# Patient Record
Sex: Female | Born: 1940 | Race: White | Hispanic: No | Marital: Married | Smoking: Never smoker
Health system: Southern US, Community
[De-identification: ages and names within clinical notes are randomized; demographics above are authoritative.]

## PROBLEM LIST (undated history)

## (undated) DIAGNOSIS — K219 Gastro-esophageal reflux disease without esophagitis: Secondary | ICD-10-CM

## (undated) DIAGNOSIS — I10 Essential (primary) hypertension: Secondary | ICD-10-CM

## (undated) DIAGNOSIS — Z87442 Personal history of urinary calculi: Secondary | ICD-10-CM

## (undated) HISTORY — PX: APPENDECTOMY: SHX54

## (undated) HISTORY — PX: TONSILLECTOMY: SUR1361

## (undated) HISTORY — PX: ABDOMINAL HYSTERECTOMY: SHX81

## (undated) HISTORY — PX: JOINT REPLACEMENT: SHX530

---

## 2004-07-12 ENCOUNTER — Encounter: Admission: RE | Admit: 2004-07-12 | Discharge: 2004-07-12 | Payer: Self-pay | Admitting: Family Medicine

## 2005-11-05 ENCOUNTER — Encounter: Admission: RE | Admit: 2005-11-05 | Discharge: 2005-11-05 | Payer: Self-pay | Admitting: Plastic Surgery

## 2005-11-12 ENCOUNTER — Ambulatory Visit (HOSPITAL_COMMUNITY): Admission: RE | Admit: 2005-11-12 | Discharge: 2005-11-12 | Payer: Self-pay | Admitting: Plastic Surgery

## 2006-01-04 ENCOUNTER — Encounter: Admission: RE | Admit: 2006-01-04 | Discharge: 2006-01-04 | Payer: Self-pay | Admitting: Family Medicine

## 2006-09-26 ENCOUNTER — Emergency Department (HOSPITAL_COMMUNITY): Admission: EM | Admit: 2006-09-26 | Discharge: 2006-09-26 | Payer: Self-pay | Admitting: Emergency Medicine

## 2007-01-27 ENCOUNTER — Encounter: Admission: RE | Admit: 2007-01-27 | Discharge: 2007-01-27 | Payer: Self-pay | Admitting: Family Medicine

## 2007-10-06 ENCOUNTER — Inpatient Hospital Stay (HOSPITAL_COMMUNITY): Admission: RE | Admit: 2007-10-06 | Discharge: 2007-10-09 | Payer: Self-pay | Admitting: Orthopedic Surgery

## 2008-03-02 ENCOUNTER — Encounter: Admission: RE | Admit: 2008-03-02 | Discharge: 2008-03-02 | Payer: Self-pay | Admitting: Family Medicine

## 2009-03-08 ENCOUNTER — Encounter: Admission: RE | Admit: 2009-03-08 | Discharge: 2009-03-08 | Payer: Self-pay | Admitting: Family Medicine

## 2009-05-05 ENCOUNTER — Encounter: Admission: RE | Admit: 2009-05-05 | Discharge: 2009-05-05 | Payer: Self-pay | Admitting: Family Medicine

## 2010-03-14 ENCOUNTER — Encounter: Admission: RE | Admit: 2010-03-14 | Discharge: 2010-03-14 | Payer: Self-pay | Admitting: Family Medicine

## 2011-03-27 ENCOUNTER — Other Ambulatory Visit: Payer: Self-pay | Admitting: Family Medicine

## 2011-03-27 DIAGNOSIS — Z1231 Encounter for screening mammogram for malignant neoplasm of breast: Secondary | ICD-10-CM

## 2011-04-04 ENCOUNTER — Ambulatory Visit
Admission: RE | Admit: 2011-04-04 | Discharge: 2011-04-04 | Disposition: A | Discharge status: Home / Self Care | Payer: Medicare Other | Source: Ambulatory Visit | Attending: Family Medicine | Admitting: Family Medicine

## 2011-04-04 DIAGNOSIS — Z1231 Encounter for screening mammogram for malignant neoplasm of breast: Secondary | ICD-10-CM

## 2011-05-01 NOTE — Op Note (Signed)
NAMESHATON, LORE NO.:  [id number]   MEDICAL RECORD NO.:  [id number]          PATIENT TYPE:  INP   LOCATION:  0005                         FACILITY:  [facility]   PHYSICIAN:  Ollen Gross, M.D.    DATE OF BIRTH:  10/21/1941   DATE OF PROCEDURE:  10/06/2007  DATE OF DISCHARGE:                               OPERATIVE REPORT   PREOPERATIVE DIAGNOSES:  Osteoarthritis left hip with protrusio  deformity.   POSTOPERATIVE DIAGNOSES:  Osteoarthritis left hip with protrusio  deformity.   PROCEDURE:  Left total hip arthroplasty with acetabular autografting.   SURGEON:  Dr. Homero Fellers Aluisio.   ASSISTANT:  Avel Peace, PA-C.   ANESTHESIA:  General.   ESTIMATED BLOOD LOSS:  450.   DRAINS:  None.   COMPLICATIONS:  None.   CONDITION:  Stable to recovery.   BRIEF CLINICAL NOTE:  Ms. Hartzog is a 70 year old female who has end-  stage arthritis of the left hip with severe protrusio deformity.  She  has failed nonoperative management and presents for total hip  arthroplasty.   PROCEDURE IN DETAIL:  After successful administration of general  anesthetic, the patient's placed in the right lateral decubitus position  with the left side up and held with the hip positioner. The left lower  extremity was isolated from her perineum with plastic drapes and prepped  and draped in the usual sterile fashion. A short posterolateral incision  was made with a 10 blade through the subcutaneous tissue to the fascia  lata which was incised in line with the skin incision.  The sciatic  nerve was palpated and protected and the short rotators isolated off the  femur.  Capsulectomy was performed.  I attempted to dislocate the hip  but the femoral head was so far protruded that it was not safe to do  this.  We then cut the femoral neck in situ and then translated the  femur anteriorly.  I then placed a trial prosthesis in the position such  that the center of the trial head would be  slightly below the tip of the  greater trochanter and marked the osteotomy line on the femoral neck.  Osteotomy is made with an oscillating saw.  The remainder of the femoral  neck that we resected is removed.  We are now able to translate the  femur easier anteriorly to gain better acetabular exposure.   The acetabular retractors were placed and labrum removed.  I used the  corkscrew to remove the femoral head from the acetabulum.  It was  severely protruded.  We started reaming at 43 just to abrade the medial  wall and then I just reamed at the rim up to 53.  We then took the  cancellus bone from the resected femoral head and the femoral neck and  impacted that into the acetabular protrusio defect and essentially  lateralized it back to a more normal location.  I placed the reverse  reamer, a size 51, to impact it. The 54 mm pinnacle acetabular shell was  then placed in an anatomic position with excellent purchase and  transfixed with  two dome screws.  The apex hole eliminator was placed  and the permanent 36 mm neutral Ultamet metal liner was placed.   The femur was prepared with canal finder irrigation.  Axial reaming was  performed to 15.5 mm, proximal reaming to a 67F and the sleeve machined  to a small.  A 67F small trial sleeve was placed with a 20 x 15 stem and  a 36 standard neck matching native anteversion.  A trial 36 plus 0 head  is placed.  The hip is reduced with great stability.  There is full  extension and full external rotation, 70 degrees flexion, 40 degrees  adduction, 90 degrees internal rotation and 90 degrees of flexion and 70  degrees of internal rotation.  By placing the left leg on top of the  right, I felt as though the leg lengths were equal.  The hip was then  dislocated and trials removed.  A permanent 67F small sleeve is placed  with a 20 x 15 stem, 36 standard neck matching native anteversion.  A 36  plus 0 head is placed and the hip is reduced with the  same stability  parameters. The wound is copiously irrigated with saline solution and  short rotators reattached to the femur through drill holes. The fascia  lata was closed with interrupted #1 Vicryl,  subcu closed #1 and #2-0  Vicryl, subcuticular running 4-0 Monocryl.  The incision was cleaned and  dried and Steri-Strips and a bulky sterile dressing applied.  She is  then placed into a knee immobilizer, awakened and transported to  recovery in stable condition.      Ollen Gross, M.D.  Electronically Signed     FA/MEDQ  D:  10/06/2007  T:  10/07/2007  Job:  [id number]

## 2011-05-01 NOTE — H&P (Signed)
Annette Hernandez, Annette Hernandez NO.:  [id number]   MEDICAL RECORD NO.:  [id number]          PATIENT TYPE:  INP   LOCATION:  1603                         FACILITY:  [facility]   PHYSICIAN:  Ollen Gross, M.D.    DATE OF BIRTH:  06/22/1941   DATE OF ADMISSION:  10/06/2007  DATE OF DISCHARGE:                              HISTORY & PHYSICAL   DATE OF OFFICE VISIT HISTORY AND PHYSICAL:  September 25, 2007.   CHIEF COMPLAINT:  Left hip pain.   HISTORY OF PRESENT ILLNESS:  The patient is a 70 year old female who has  been seeing Dr. Lequita Halt for a long history in regards to her left hip.  She has been having pain for over 10 years now.  X-rays in the past  history have shown she has had Paget's disease which was diagnosed and  treated at [facility].  She has been quite active, but unfortunately has  progressive in pain and dysfunction.  She is seen by Dr. Lequita Halt, found  to have end-stage arthritis with a severe protrusio deformity.  It is  felt that she would best be served by undergoing surgical intervention.  Risks and benefits discussed.  The patient subsequently was admitted to  the hospital.   ALLERGIES:  NO KNOWN DRUG ALLERGIES.   CURRENT MEDICATIONS:  1. Tylenol.  2. Tramadol.  3. Micardis.  4. Premarin.  5. Multivitamin.   PAST MEDICAL HISTORY:  1. Hypertension.  2. Mitral valve prolapse.  3. Paget's disease.  4. Postmenopausal.   PAST SURGICAL HISTORY:  1. C-section.  2. History of hysterectomy with bladder tack.   SOCIAL HISTORY:  Married, nonsmoker.  Two glasses of wine or two beers  daily.  Two children.  Husband will be assisting with care after  surgery.   REVIEW OF SYSTEMS:  GENERAL:  No fevers, chills, night sweats.  NEUROLOGIC:  No seizures, syncope or paralysis.  RESPIRATORY:  No  shortness of breath, productive cough or hemoptysis.  CARDIOVASCULAR:  No chest pain, angina or orthopnea.  GI: No nausea, vomiting, diarrhea  or constipation.  GU: No dysuria,  hematuria or discharge.  MUSCULOSKELETAL:  Left hip.   PHYSICAL EXAMINATION:  VITAL SIGNS:  Pulse 74, respirations 12, blood  pressure 146/70.  GENERAL:  A 70 year old white female, well-nourished, well-developed,  petite, thin, in no acute distress, alert and oriented, cooperative,  very pleasant, excellent historian.  HEENT:  Normocephalic, atraumatic.  Pupils are round and reactive.  Oropharynx clear.  Extraocular movements intact.  NECK:  Supple.  CHEST:  Clear anterior and posterior chest walls.  No rhonchi, rales or  wheezing.  HEART:  Regular rate and rhythm.  No murmur.  S1 and S2 noted.  ABDOMEN:  Soft, nontender.  Bowel sounds present.  RECTAL/BREAST/GENITALIA:  Not done.  Not pertinent to present illness.  EXTREMITIES:  Left hip flexion 90 degrees, zero internal rotation, 10  degrees external rotation, 10 degrees abduction.   IMPRESSION:  1. Osteoarthritis of left hip with protrusio deformity.  2. Paget's disease.  3. Postmenopausal.  4. Mitral valve prolapse.  5. Hypertension.   PLAN:  The patient  will be admitted to [facility] to undergo a  left total hip replacement arthroplasty with acetabular autografting.  Surgery will be performed by Dr. Ollen Gross.  The patient has been  seen preoperatively by Dr. Catha Gosselin and felt to be stable for up and  coming surgery.      Alexzandrew L. Perkins, P.A.C.      Ollen Gross, M.D.  Electronically Signed    ALP/MEDQ  D:  10/05/2007  T:  10/06/2007  Job:  [id number]   cc:   Caryn Bee L. Little, M.D.  Fax: [phone]

## 2011-05-04 NOTE — Discharge Summary (Signed)
Annette Hernandez, Annette Hernandez NO.:  [id number]   MEDICAL RECORD NO.:  [id number]          PATIENT TYPE:  INP   LOCATION:  1603                         FACILITY:  [facility]   PHYSICIAN:  Ollen Gross, M.D.    DATE OF BIRTH:  10-02-1941   DATE OF ADMISSION:  10/06/2007  DATE OF DISCHARGE:  10/09/2007                               DISCHARGE SUMMARY   ADMITTING DIAGNOSES:  1. Osteoarthritis left hip with protrusio deformity.  2. Paget's disease.  3. Postmenopausal.  4. Mitral valve prolapse.  5. Hypertension.   DISCHARGE DIAGNOSES:  1. Osteoarthritis left hip with protrusio deformity status post left      total hip arthroplasty with left acetabular autografting.  2. Postoperative hyponatremia.  3. Postoperative blood loss anemia, did not require transfusion.  4. Paget's disease.  5. Postmenopausal.  6. Mitral valve prolapse.  7. Hypertension.   PROCEDURE:  October 06, 2007, left total hip with acetabular  autografting.  Surgeon:  Dr. Lequita Halt.  Assistant:  Avel Peace, PA-C.   CONSULTS:  None.   BRIEF HISTORY:  Annette Hernandez is a 70 year old female with end-stage  arthritis of the left hip, severe protrusio deformity, failed  nonoperative management and now presents for total hip arthroplasty.   LABORATORY DATA:  Preoperative CBC:  Hemoglobin 12.6, hematocrit 36.4,  white cell count 7.3.  Hemoglobin dropped down to 8.8, then to 8.7, back  up to 8.9 and 26.3.  PT/PTT preoperatively 13.5 and 29 respectively, INR  1.0.  Serial pro times followed.  Last noted PT/INR 21.4 and 1.8.  Chemistry panel on admission all within normal limits with the exception  of total protein a little low at 5.9.  Serial BMETs were followed.  Sodium did drop from 139 to 132, stabilized at 131.  Glucose went up  from 103 to 152, back down to 116.  Electrolytes:  Remaining  electrolytes remained within normal limits.  Preoperative UA:  Small  leukocyte esterase, few epithelials, only 3-6 white  and 0-2 red cells,  otherwise negative.  Blood group/type O positive.   Chest x-ray, October 02, 2007:  No active disease or interval change.  Portable pelvis and hip films:  Well-seated components left total hip  arthroplasty.  Preoperative hip films, left hip, October 02, 2007:  Severe degenerative changes left hip, protrusio acetabula on the left,  patchy lytic sclerotic process left ilium, consider Paget's disease or  metastatic disease (patient with known history of Paget's disease).   EKG dated September 04, 2007:  Normal sinus rhythm, rate 66, high QRS  voltage, confirmed by Dr. Clarene Duke.   HOSPITAL COURSE:  The patient admitted to [facility],  tolerated procedure well.  Later transferred to the recovery room and  the orthopedic floor, started on PCA and p.o. analgesic pain control  following surgery.  She was doing pretty well on the morning of day #1,  did have some spasms, encouraged muscle relaxants, starting getting up  out of bed, resumed her hypertensive medications but put them on  parameters due to the blood pressure was running a little on the lower  side.  She did have a drop in her hemoglobin down to 8.8 which she was  placed on iron supplements.  She was asymptomatic with this, started  getting up out of bed by day #2, doing a little bit better.  Did have  some soreness after getting up.  Encouraged p.o. medications.  She had  been weaned off, initially start on PCA but weaned over to p.o.  medications.  Hemoglobin was down to 8.7 but she was asymptomatic.  Dressing was changed, incision looked good, progressed well with her  therapy, and by day #3 she was doing better, in good spirits.  Hemoglobin was back up to 8.9, it was stabilized.  She was not  complaining of any other issues.  Incision healing well, progressing  with therapy and was discharged home.   DISCHARGE PLAN:  1. The patient discharged home on October 09, 2007.  2. Discharge diagnoses:   Please see above.  3. Discharge medications:  Coumadin, Flexeril, Percocet, Nu-Iron.   ACTIVITY:  Partial weightbearing 25-50% left lower extremity, total hip  precautions and hip total hip protocol.   FOLLOWUP:  In 2 weeks.   DIET:  Resume heart-healthy diet.   DISPOSITION:  Home.   CONDITION UPON DISCHARGE:  Improving.      Alexzandrew L. Perkins, P.A.C.      Ollen Gross, M.D.  Electronically Signed    ALP/MEDQ  D:  11/25/2007  T:  11/25/2007  Job:  [id number]   cc:   Caryn Bee L. Little, M.D.  Fax: [phone]

## 2011-06-28 ENCOUNTER — Other Ambulatory Visit (HOSPITAL_COMMUNITY): Payer: Self-pay | Admitting: Orthopedic Surgery

## 2011-06-28 DIAGNOSIS — M25552 Pain in left hip: Secondary | ICD-10-CM

## 2011-06-29 ENCOUNTER — Ambulatory Visit (HOSPITAL_COMMUNITY): Admission: RE | Admit: 2011-06-29 | Payer: Medicare Other | Source: Ambulatory Visit | Admitting: Specialist

## 2011-06-29 ENCOUNTER — Emergency Department (HOSPITAL_COMMUNITY): Payer: Medicare Other

## 2011-06-29 ENCOUNTER — Inpatient Hospital Stay (HOSPITAL_COMMUNITY)
Admission: RE | Admit: 2011-06-29 | Discharge: 2011-07-07 | DRG: 466 | Disposition: A | Discharge status: Home Health | Payer: Medicare Other | Source: Ambulatory Visit | Attending: Internal Medicine | Admitting: Internal Medicine

## 2011-06-29 ENCOUNTER — Emergency Department (HOSPITAL_COMMUNITY)
Admission: EM | Admit: 2011-06-29 | Discharge: 2011-06-29 | Disposition: A | Discharge status: Other Acute Inpatient Hospital | Payer: Medicare Other | Source: Home / Self Care | Attending: Emergency Medicine | Admitting: Emergency Medicine

## 2011-06-29 DIAGNOSIS — R197 Diarrhea, unspecified: Secondary | ICD-10-CM | POA: Diagnosis present

## 2011-06-29 DIAGNOSIS — E871 Hypo-osmolality and hyponatremia: Secondary | ICD-10-CM | POA: Insufficient documentation

## 2011-06-29 DIAGNOSIS — M009 Pyogenic arthritis, unspecified: Secondary | ICD-10-CM | POA: Insufficient documentation

## 2011-06-29 DIAGNOSIS — K5900 Constipation, unspecified: Secondary | ICD-10-CM | POA: Diagnosis not present

## 2011-06-29 DIAGNOSIS — R5381 Other malaise: Secondary | ICD-10-CM | POA: Insufficient documentation

## 2011-06-29 DIAGNOSIS — D6489 Other specified anemias: Secondary | ICD-10-CM | POA: Diagnosis present

## 2011-06-29 DIAGNOSIS — A419 Sepsis, unspecified organism: Secondary | ICD-10-CM | POA: Diagnosis present

## 2011-06-29 DIAGNOSIS — I10 Essential (primary) hypertension: Secondary | ICD-10-CM | POA: Insufficient documentation

## 2011-06-29 DIAGNOSIS — E876 Hypokalemia: Secondary | ICD-10-CM | POA: Diagnosis present

## 2011-06-29 DIAGNOSIS — R404 Transient alteration of awareness: Secondary | ICD-10-CM | POA: Insufficient documentation

## 2011-06-29 DIAGNOSIS — M889 Osteitis deformans of unspecified bone: Secondary | ICD-10-CM | POA: Diagnosis present

## 2011-06-29 DIAGNOSIS — D72829 Elevated white blood cell count, unspecified: Secondary | ICD-10-CM | POA: Diagnosis present

## 2011-06-29 DIAGNOSIS — R7309 Other abnormal glucose: Secondary | ICD-10-CM | POA: Diagnosis not present

## 2011-06-29 DIAGNOSIS — T8450XA Infection and inflammatory reaction due to unspecified internal joint prosthesis, initial encounter: Principal | ICD-10-CM | POA: Diagnosis present

## 2011-06-29 DIAGNOSIS — M25559 Pain in unspecified hip: Secondary | ICD-10-CM | POA: Insufficient documentation

## 2011-06-29 DIAGNOSIS — R509 Fever, unspecified: Secondary | ICD-10-CM | POA: Insufficient documentation

## 2011-06-29 DIAGNOSIS — D6959 Other secondary thrombocytopenia: Secondary | ICD-10-CM | POA: Diagnosis present

## 2011-06-29 DIAGNOSIS — A4101 Sepsis due to Methicillin susceptible Staphylococcus aureus: Secondary | ICD-10-CM | POA: Diagnosis present

## 2011-06-29 DIAGNOSIS — Z96649 Presence of unspecified artificial hip joint: Secondary | ICD-10-CM

## 2011-06-29 DIAGNOSIS — E860 Dehydration: Secondary | ICD-10-CM | POA: Insufficient documentation

## 2011-06-29 DIAGNOSIS — R5383 Other fatigue: Secondary | ICD-10-CM | POA: Diagnosis present

## 2011-06-29 DIAGNOSIS — Y831 Surgical operation with implant of artificial internal device as the cause of abnormal reaction of the patient, or of later complication, without mention of misadventure at the time of the procedure: Secondary | ICD-10-CM | POA: Diagnosis present

## 2011-06-29 DIAGNOSIS — R112 Nausea with vomiting, unspecified: Secondary | ICD-10-CM | POA: Diagnosis present

## 2011-06-29 LAB — GRAM STAIN

## 2011-06-29 LAB — COMPREHENSIVE METABOLIC PANEL
ALT: 28 U/L (ref 0–35)
AST: 34 U/L (ref 0–37)
Alkaline Phosphatase: 99 U/L (ref 39–117)
CO2: 23 mEq/L (ref 19–32)
Calcium: 9.4 mg/dL (ref 8.4–10.5)
GFR calc Af Amer: >60 mL/min (ref >60)
Glucose, Bld: 145 mg/dL — ABNORMAL HIGH (ref 70–99)
Potassium: 3.6 mEq/L (ref 3.5–5.1)
Sodium: 130 mEq/L — ABNORMAL LOW (ref 135–145)
Total Protein: 6.1 g/dL (ref 6.0–8.3)

## 2011-06-29 LAB — URINE MICROSCOPIC-ADD ON

## 2011-06-29 LAB — URINALYSIS, ROUTINE W REFLEX MICROSCOPIC
Bilirubin Urine: NEGATIVE
Ketones, ur: 15 mg/dL — AB
Nitrite: NEGATIVE
Protein, ur: 100 mg/dL — AB
Specific Gravity, Urine: 1.023 (ref 1.005–1.030)
pH: 6.5 (ref 5.0–8.0)

## 2011-06-29 LAB — DIFFERENTIAL
Basophils Absolute: 0 10*3/uL (ref 0.0–0.1)
Lymphocytes Relative: 3 % — ABNORMAL LOW (ref 12–46)
Lymphs Abs: 0.4 10*3/uL — ABNORMAL LOW (ref 0.7–4.0)
Monocytes Absolute: 0.8 10*3/uL (ref 0.1–1.0)
Monocytes Relative: 6 % (ref 3–12)
Neutro Abs: 11.9 10*3/uL — ABNORMAL HIGH (ref 1.7–7.7)
Neutrophils Relative %: 91 % — ABNORMAL HIGH (ref 43–77)

## 2011-06-29 LAB — SYNOVIAL CELL COUNT + DIFF, W/ CRYSTALS
Eosinophils-Synovial: 0 % (ref 0–1)
Neutrophil, Synovial: 96 % — ABNORMAL HIGH (ref 0–25)
WBC, Synovial: 53600 /mm3 — ABNORMAL HIGH (ref 0–200)

## 2011-06-29 LAB — SEDIMENTATION RATE: Sed Rate: 37 mm/hr — ABNORMAL HIGH (ref 0–22)

## 2011-06-29 LAB — CK TOTAL AND CKMB (NOT AT ARMC)
CK, MB: 1.9 ng/mL (ref 0.3–4.0)
Relative Index: 0.6 (ref 0.0–2.5)

## 2011-06-29 LAB — CBC
HCT: 33.6 % — ABNORMAL LOW (ref 36.0–46.0)
Hemoglobin: 11.8 g/dL — ABNORMAL LOW (ref 12.0–15.0)
MCH: 31.2 pg (ref 26.0–34.0)
MCHC: 35.1 g/dL (ref 30.0–36.0)
MCV: 88.9 fL (ref 78.0–100.0)
RBC: 3.78 MIL/uL — ABNORMAL LOW (ref 3.87–5.11)
RDW: 13.3 % (ref 11.5–15.5)

## 2011-06-29 LAB — SURGICAL PCR SCREEN
MRSA, PCR: NEGATIVE
Staphylococcus aureus: POSITIVE — AB

## 2011-06-29 LAB — TROPONIN I: Troponin I: <0.3 ng/mL (ref <0.30)

## 2011-06-29 LAB — TYPE AND SCREEN
ABO/RH(D): O POS
Antibody Screen: NEGATIVE

## 2011-06-29 NOTE — H&P (Signed)
NAMEMARLENY, [name].:  [id number]  MEDICAL RECORD NO.:  [id number]  LOCATION:  [facility]                         FACILITY:  [facility]  PHYSICIAN:  Jonny Ruiz, MD    DATE OF BIRTH:  11/03/1941  DATE OF ADMISSION:  06/29/2011 DATE OF DISCHARGE:                             HISTORY & PHYSICAL   CHIEF COMPLAINT:  Left hip pain.  HISTORY OF PRESENT ILLNESS:  The patient is a 70 year old female with a past medical history significant for left hip placement in 2008 by Dr. Lequita Halt who was doing well until about 5 days ago when she developed progressive pain in the left hip associated to feeling clammy and sweaty.   The patient has been feeling sick for the last several days.  She saw Dr. Deri Fuelling PA on Tuesday and underwent x-rays of the left hip reportedly normal.  It was thought that she had a stress fracture and a bone scan was ordered.  The patient was prescribed with hydrocodone in daytime and Ambien at night.  Over the last 2 days, she has been feeling very weak and out of it.  She is sleeping excessively and not been herself.  Two weeks prior to admission [place] and her left lower leg hit the pedal causing a deep abrassion. This morning her husband noticed that she was somewhat pale and diaphoretic and called 911. The pt was brought to our hospital for further management. The history was obtained from patient's husband since she wished not to talk.   PAST MEDICAL HISTORY:  Significant for essential hypertension, Paget's disease, and left hip osteoarthritis.  Primary care physician is Dr. Nicholos Johns.  PAST SURGICAL HISTORY:  Significant for left hip arthroplasty in 2008 and laceration repair in 2007.  CURRENT MEDICATIONS: 1. Losartan 50 mg a day. 2. Premarin 0.3 once a day. 3. Zolpidem 5 mg at bedtime. 4. Hydrocodone/acetaminophen p.r.n. for pain.  ALLERGIES:  No known drug allergies.  FAMILY HISTORY:  Noncontributory.  SOCIAL  HISTORY:  The patient is married.  Her husband is at the bedside. She is a nonsmoker and denies illicit.  She drinks approximately 2 beers or 2 glasses of wine per day.  Denies abuse.    REVIEW OF SYSTEMS:  Unable to obtain other than the what stated in the HPI.  PHYSICAL EXAMINATION:  VITAL SIGNS:  The patient's blood pressure is 105/62, heart rate at the time I saw her 110 and 112 on monitor, temperature 98.9, respirations 12, initial blood pressure 96/59. GENERAL APPEARANCE:  The patient appears uncomfortable and somewhat lethargic. SKIN:  Reveals a 3-cm abrasion on the left lower leg which has developed crust with mild surrounding erythema.  It is not warm to touch and mildly tender.  There is no purulence HEART:  Regular S1 and S2 without murmurs. LUNGS:  Clear to auscultation. ABDOMEN:  Has normal bowel sounds, soft and nontender without organomegaly or masses palpable. EXTREMITIES:  The left hip is extremely tender to touch as well as flexion and extension. NEUROLOGIC:  Nonfocal.  IMAGING:  Chest x-ray reveals suboptimal inspiration which accounts for mild bibasilar atelectasis.  No acute cardiopulmonary disease.  Left hip  x-ray shows no acute osseous abnormalities.  Left hip arthroplasty with anatomic alignment and no complicating features.  Paget's disease involving the left hemipelvis.  EKG, normal sinus rhythm at 94 beats per minute with no ST or T-wave abnormalities.  CT scan of the head normal.  LABORATORY DATA:  Urine negative.  CK total 330, MB 1.9.  Troponin less than 0.30.  Comprehensive metabolic panel; sodium 130, potassium 3.6, chloride 96, carbon dioxide 23, glucose 145, BUN 28, creatinine 0.78, calcium 9.4.  Liver function normal.  CBC; WBC 13.1 with 91% neutrophils, hemoglobin 11.8, hematocrit 33.6, MCV 88.9, platelet count 133.  IMPRESSION:  A 70 year old female with a history of left hip arthroplasty due to osteoarthritis in 2008, presenting with  worsening left hip pain as well as sinus tachycardia and leukocytosis after suffering an abrassion to the left lower leg 2 weeks prior to admission.  Diagnostic impression: septic arthritis and therefore the patient will be admitted for further management; Blood cultures x2 STAT as well as an aspiration of the left hip.  Case was discussed with her orthopedic surgeon, Dr. Lequita Halt who wants to hold off on antibiotics for the time being until we get results of the aspiration.  The patient will be admitted to a regular medical bed team 4 and will be given appropriate pain control.  She will receive standard DVT prophylaxis with Lovenox and her blood pressure medication will be on hold if her systolic blood pressure remains under 110.  She will be given IV fluids with normal saline at 125 mL an hour in view of her current BP. For her mild hyponatremia, I will rehydrate and repeat BMET in am.  Her mildly elevated CK is not clinically significant. Monitor K.          ______________________________ Jonny Ruiz, MD     GL/MEDQ  D:  06/29/2011  T:  06/29/2011  Job:  [id number]  Electronically Signed by Jonny Ruiz MD on 06/29/2011 05:30:02 PM

## 2011-06-30 LAB — DIFFERENTIAL
Basophils Absolute: 0 10*3/uL (ref 0.0–0.1)
Basophils Relative: 0 % (ref 0–1)
Eosinophils Absolute: 0 10*3/uL (ref 0.0–0.7)
Eosinophils Relative: 0 % (ref 0–5)
Lymphocytes Relative: 7 % — ABNORMAL LOW (ref 12–46)
Lymphs Abs: 0.8 10*3/uL (ref 0.7–4.0)
Monocytes Absolute: 0.5 10*3/uL (ref 0.1–1.0)
Neutrophils Relative %: 88 % — ABNORMAL HIGH (ref 43–77)

## 2011-06-30 LAB — CBC
MCH: 31 pg (ref 26.0–34.0)
MCHC: 34.1 g/dL (ref 30.0–36.0)
Platelets: 93 10*3/uL — ABNORMAL LOW (ref 150–400)
RBC: 3.35 MIL/uL — ABNORMAL LOW (ref 3.87–5.11)
RDW: 13.6 % (ref 11.5–15.5)

## 2011-06-30 LAB — COMPREHENSIVE METABOLIC PANEL
ALT: 22 U/L (ref 0–35)
AST: 30 U/L (ref 0–37)
Albumin: 1.8 g/dL — ABNORMAL LOW (ref 3.5–5.2)
Calcium: 8.1 mg/dL — ABNORMAL LOW (ref 8.4–10.5)
Sodium: 133 mEq/L — ABNORMAL LOW (ref 135–145)
Total Protein: 5.1 g/dL — ABNORMAL LOW (ref 6.0–8.3)

## 2011-06-30 LAB — URINE CULTURE
Colony Count: NO GROWTH
Culture  Setup Time: 201207131110

## 2011-07-01 DIAGNOSIS — T8450XA Infection and inflammatory reaction due to unspecified internal joint prosthesis, initial encounter: Secondary | ICD-10-CM

## 2011-07-01 DIAGNOSIS — A4901 Methicillin susceptible Staphylococcus aureus infection, unspecified site: Secondary | ICD-10-CM

## 2011-07-01 DIAGNOSIS — R7881 Bacteremia: Secondary | ICD-10-CM

## 2011-07-01 DIAGNOSIS — Z96649 Presence of unspecified artificial hip joint: Secondary | ICD-10-CM

## 2011-07-01 LAB — BASIC METABOLIC PANEL
BUN: 13 mg/dL (ref 6–23)
Calcium: 7.7 mg/dL — ABNORMAL LOW (ref 8.4–10.5)
Chloride: 101 mEq/L (ref 96–112)
Creatinine, Ser: 0.74 mg/dL (ref 0.50–1.10)
GFR calc Af Amer: >60 mL/min (ref >60)
GFR calc non Af Amer: >60 mL/min (ref >60)

## 2011-07-01 LAB — CBC
HCT: 28.9 % — ABNORMAL LOW (ref 36.0–46.0)
MCHC: 34.3 g/dL (ref 30.0–36.0)
MCV: 89.8 fL (ref 78.0–100.0)
Platelets: 89 10*3/uL — ABNORMAL LOW (ref 150–400)
RDW: 14 % (ref 11.5–15.5)
WBC: 9.1 10*3/uL (ref 4.0–10.5)

## 2011-07-02 LAB — MAGNESIUM: Magnesium: 2 mg/dL (ref 1.5–2.5)

## 2011-07-02 LAB — BASIC METABOLIC PANEL
BUN: 12 mg/dL (ref 6–23)
Chloride: 100 mEq/L (ref 96–112)
GFR calc Af Amer: >60 mL/min (ref >60)
GFR calc non Af Amer: >60 mL/min (ref >60)
Potassium: 3.4 mEq/L — ABNORMAL LOW (ref 3.5–5.1)
Sodium: 131 mEq/L — ABNORMAL LOW (ref 135–145)

## 2011-07-02 LAB — CBC
HCT: 28 % — ABNORMAL LOW (ref 36.0–46.0)
MCHC: 35 g/dL (ref 30.0–36.0)
Platelets: 100 10*3/uL — ABNORMAL LOW (ref 150–400)
RDW: 13.7 % (ref 11.5–15.5)
WBC: 11.7 10*3/uL — ABNORMAL HIGH (ref 4.0–10.5)

## 2011-07-02 LAB — BODY FLUID CULTURE

## 2011-07-02 LAB — PATHOLOGIST SMEAR REVIEW

## 2011-07-02 LAB — CULTURE, BLOOD (ROUTINE X 2): Culture  Setup Time: 201207132341

## 2011-07-02 LAB — VANCOMYCIN, TROUGH: Vancomycin Tr: 10.3 ug/mL (ref 10.0–20.0)

## 2011-07-03 LAB — CBC
HCT: 25.3 % — ABNORMAL LOW (ref 36.0–46.0)
MCHC: 34.8 g/dL (ref 30.0–36.0)
Platelets: 160 10*3/uL (ref 150–400)
RDW: 14 % (ref 11.5–15.5)

## 2011-07-03 LAB — BASIC METABOLIC PANEL
BUN: 12 mg/dL (ref 6–23)
GFR calc Af Amer: >60 mL/min (ref >60)
GFR calc non Af Amer: >60 mL/min (ref >60)
Potassium: 4 mEq/L (ref 3.5–5.1)
Sodium: 130 mEq/L — ABNORMAL LOW (ref 135–145)

## 2011-07-04 LAB — BASIC METABOLIC PANEL
Glucose, Bld: 112 mg/dL — ABNORMAL HIGH (ref 70–99)
Potassium: 3.8 mEq/L (ref 3.5–5.1)
Sodium: 133 mEq/L — ABNORMAL LOW (ref 135–145)

## 2011-07-04 LAB — IRON AND TIBC
Iron: 16 ug/dL — ABNORMAL LOW (ref 42–135)
TIBC: 113 ug/dL — ABNORMAL LOW (ref 250–470)

## 2011-07-04 LAB — C-REACTIVE PROTEIN: CRP: 15.8 mg/dL — ABNORMAL HIGH (ref <0.6)

## 2011-07-04 LAB — ANAEROBIC CULTURE

## 2011-07-04 LAB — CBC
Hemoglobin: 8.6 g/dL — ABNORMAL LOW (ref 12.0–15.0)
MCHC: 35.8 g/dL (ref 30.0–36.0)

## 2011-07-04 NOTE — Op Note (Signed)
  NAMEEVOLETH, NORDMEYER NO.:  [id number]  MEDICAL RECORD NO.:  [id number]  LOCATION:  1235                         FACILITY:  [facility]  PHYSICIAN:  Erasmo Leventhal, M.D.DATE OF BIRTH:  08/11/1941  DATE OF PROCEDURE:  06/29/2011 DATE OF DISCHARGE:                              OPERATIVE REPORT   TIME:  9: 20 p.m.  PREOPERATIVE DIAGNOSIS:  Septic arthritis, left hip arthroplasty.  POSTOPERATIVE DIAGNOSIS:  Septic arthritis, left hip arthroplasty.  PROCEDURE:  Irrigation and debridement of left hip joint, deep.  SURGEON:  Erasmo Leventhal, M.D.  ASSISTANT:  Jamelle Rushing, P.A.C.  ANESTHESIA:  General.  ESTIMATED BLOOD LOSS:  100 cc.  DRAINS:  Hemovac.  COMPLICATIONS:  None.  DISPOSITION:  PACU, stable.  OPERATIVE FINDINGS:  Homero Fellers pus in left hip joint, appeared to be acute, minimal synovitis.  OPERATIVE DETAILS:  The patient was counseled at [facility] and then also in the holding area and discussed this with the family.  They wished to proceed.  Also I discussed with Dr. Lequita Halt.  The patient was taken to the operating room, placed under general anesthesia, turned to right lateral decubitus position,  properly padded and bumped.  Left hip and thigh were prepped with DuraPrep and draped in sterile fashion.  The previous incision was utilized for incision through skin and subcutaneous tissue.  The fascia was identified, opened in a standard __________ plane.  Subperiosteal dissection was then taken to lateral aspect of the greater trochanter down to hip joint where frank pus was encountered.  The hip capsule was then opened.  There was no evidence of acute synovitis.  Soft tissue had minimal reaction but there was frank pus present.  The implant was found to be markedly stable, as was the hip joint.  At this point in time, the capsule was opened.  Old suture was removed as encountered.  It was copiously irrigated with multiple liters of saline,  also 500 cc of antibiotic solution.  Following this, a medium Hemovac drain was placed.  The capsule was left open, the fascia was loosely closed with Vicryl subcutaneous, Vicryl to skin with staples.  Drain were hooked to suction.  Sterile dressing was applied.  She was turned supine.  She tolerated the procedure with no complications or problems.  She will be stabilized in the PACU and observe closely.  To help with surgery technique and decision making, Arlyn Leak, PA-C assistance was needed.          ______________________________ Erasmo Leventhal, M.D.     RAC/MEDQ  D:  06/29/2011  T:  06/30/2011  Job:  [id number]  Electronically Signed by Eugenia Mcalpine M.D. on 07/04/2011 04:52:34 PM

## 2011-07-05 ENCOUNTER — Other Ambulatory Visit (HOSPITAL_COMMUNITY): Payer: Medicare Other

## 2011-07-05 ENCOUNTER — Inpatient Hospital Stay (HOSPITAL_COMMUNITY): Payer: Medicare Other

## 2011-07-05 ENCOUNTER — Encounter (HOSPITAL_COMMUNITY): Payer: Medicare Other

## 2011-07-05 DIAGNOSIS — Z96649 Presence of unspecified artificial hip joint: Secondary | ICD-10-CM

## 2011-07-05 DIAGNOSIS — T8450XA Infection and inflammatory reaction due to unspecified internal joint prosthesis, initial encounter: Secondary | ICD-10-CM

## 2011-07-05 DIAGNOSIS — A4901 Methicillin susceptible Staphylococcus aureus infection, unspecified site: Secondary | ICD-10-CM

## 2011-07-05 DIAGNOSIS — R7881 Bacteremia: Secondary | ICD-10-CM

## 2011-07-05 LAB — CBC
Hemoglobin: 8.5 g/dL — ABNORMAL LOW (ref 12.0–15.0)
RBC: 2.73 MIL/uL — ABNORMAL LOW (ref 3.87–5.11)
WBC: 21 10*3/uL — ABNORMAL HIGH (ref 4.0–10.5)

## 2011-07-05 LAB — DIFFERENTIAL
Basophils Relative: 0 % (ref 0–1)
Eosinophils Relative: 2 % (ref 0–5)
Lymphocytes Relative: 8 % — ABNORMAL LOW (ref 12–46)
Monocytes Relative: 5 % (ref 3–12)
Neutrophils Relative %: 85 % — ABNORMAL HIGH (ref 43–77)

## 2011-07-05 LAB — CULTURE, BLOOD (ROUTINE X 2): Culture  Setup Time: 201207151111

## 2011-07-06 LAB — BASIC METABOLIC PANEL
CO2: 23 mEq/L (ref 19–32)
Chloride: 101 mEq/L (ref 96–112)
Creatinine, Ser: 0.49 mg/dL — ABNORMAL LOW (ref 0.50–1.10)
Potassium: 3.4 mEq/L — ABNORMAL LOW (ref 3.5–5.1)

## 2011-07-06 LAB — DIFFERENTIAL
Basophils Absolute: 0.2 10*3/uL — ABNORMAL HIGH (ref 0.0–0.1)
Basophils Relative: 1 % (ref 0–1)
Lymphocytes Relative: 11 % — ABNORMAL LOW (ref 12–46)
Monocytes Relative: 7 % (ref 3–12)
Neutro Abs: 12 10*3/uL — ABNORMAL HIGH (ref 1.7–7.7)
Neutrophils Relative %: 78 % — ABNORMAL HIGH (ref 43–77)

## 2011-07-06 LAB — CBC
MCV: 88 fL (ref 78.0–100.0)
Platelets: 376 10*3/uL (ref 150–400)
RBC: 2.66 MIL/uL — ABNORMAL LOW (ref 3.87–5.11)
WBC: 15.5 10*3/uL — ABNORMAL HIGH (ref 4.0–10.5)

## 2011-07-06 LAB — CLOSTRIDIUM DIFFICILE BY PCR: Toxigenic C. Difficile by PCR: NEGATIVE

## 2011-07-06 NOTE — Op Note (Signed)
NAMECHRISTINAMARIE, TALL NO.:  [id number]  MEDICAL RECORD NO.:  [id number]  LOCATION:  1601                         FACILITY:  [facility]  PHYSICIAN:  Ollen Gross, M.D.    DATE OF BIRTH:  [birth year]  DATE OF PROCEDURE:  07/03/2011 DATE OF DISCHARGE:                              OPERATIVE REPORT   PREOPERATIVE DIAGNOSIS:  Septic arthritis left hip.  POSTOPERATIVE DIAGNOSIS:  Septic arthritis left hip.  PROCEDURE:  Irrigation and debridement of left hip with bearing exchange.  SURGEON:  Ollen Gross, M.D.  ASSISTANT:  Annette Hernandez, P.A.C.  ANESTHESIA:  General.  ESTIMATED BLOOD LOSS:  100.  DRAINS:  Hemovac x1.  COMPLICATIONS:  None.  CONDITION:  Stable to recovery.  BRIEF CLINICAL NOTE:  Annette Hernandez is a 70 year old female, who presented to the Emergency Room on Friday, June 29, 2011 with severe pain in the left hip.  Aspiration showed purulent fluid.  She had irrigation and debridement that evening by Dr. Valma Cava.  She presents today for repeat irrigation debridement and bearing exchange.  PROCEDURE IN DETAIL:  After successful administration of general anesthetic, patient was placed in right lateral decubitus position with the left side up and held with the hip positioner.  Left lower extremity was isolated from perineum with plastic drapes and prepped and draped in the usual sterile fashion.  Previous incisions were reutilized.  Skin cut with a 10 blade through subcutaneous tissue to the level of the fascia lata, which was incised in line with the skin incision.  The was palpated and protected.  There was some purulence identified when we entered the joint.  The tip fluid sent for Gram stain culture and sensitivity.  We dislocated the hip and removed the femoral head.  I removed a fair amount of the capsule anteriorly and superiorly.  We then translocated the femur anteriorly to get exposure to the acetabulum. Any  abnormal-appearing tissue was removed, but overall the quality of the tissue looked fine.  There was fairly any debris in the joint.  I was able to remove the acetabular liner from the shell.  This was a metal liner.  No evidence of any metallosis at all.  We then irrigated the wound bed with 3 liters saline using pulsatile lavage.  I then impacted the new 36-mm neutral +4 marathon liner for the 54 mm acetabular shell.  This was a pinnacle acetabular shell.  The liner had excellent fit.  It appeared to lock down circumferentially.  We then replaced the femoral head with a 36 +0 cobalt chrome femoral head.  This was the same size as are original.  Hip was reduced with excellent stability.  There was full extension, full external rotation, 70 degrees flexion, 40 degrees adduction, 90 degrees internal rotation, 90 degrees flexion and about 40 degrees of internal rotation.  The wound was again irrigated with another 3 liters of saline with pulsatile lavage.  Short rotators were then reattached to the femur through drill holes with Ethibond suture.  Fascia lata was closed over Hemovac drain with interrupted #1 Vicryl, subcu closed with #1 and 2-0 Vicryl and the skin with staples.  Drains hooked to suction.  Incision  cleaned and dried and a bulky sterile dressing applied.  She was placed into a knee immobilizer, awakened and transported to recovery in stable condition.     Ollen Gross, M.D.     FA/MEDQ  D:  07/02/2011  T:  07/02/2011  Job:  [id number]  Electronically Signed by Ollen Gross M.D. on 07/06/2011 06:05:50 PM

## 2011-07-07 LAB — MAGNESIUM: Magnesium: 2 mg/dL (ref 1.5–2.5)

## 2011-07-07 LAB — ANAEROBIC CULTURE

## 2011-07-07 LAB — CULTURE, BLOOD (ROUTINE X 2): Culture  Setup Time: 201207151111

## 2011-07-07 LAB — DIFFERENTIAL
Basophils Absolute: 0.1 10*3/uL (ref 0.0–0.1)
Eosinophils Relative: 3 % (ref 0–5)
Lymphocytes Relative: 14 % (ref 12–46)
Lymphs Abs: 1.8 10*3/uL (ref 0.7–4.0)
Monocytes Relative: 9 % (ref 3–12)
Neutrophils Relative %: 73 % (ref 43–77)

## 2011-07-07 LAB — CBC
Hemoglobin: 7.6 g/dL — ABNORMAL LOW (ref 12.0–15.0)
Platelets: 392 10*3/uL (ref 150–400)
RBC: 2.48 MIL/uL — ABNORMAL LOW (ref 3.87–5.11)
WBC: 13 10*3/uL — ABNORMAL HIGH (ref 4.0–10.5)

## 2011-07-07 LAB — BASIC METABOLIC PANEL
CO2: 22 mEq/L (ref 19–32)
Chloride: 104 mEq/L (ref 96–112)
Glucose, Bld: 121 mg/dL — ABNORMAL HIGH (ref 70–99)
Potassium: 4 mEq/L (ref 3.5–5.1)
Sodium: 131 mEq/L — ABNORMAL LOW (ref 135–145)

## 2011-07-10 LAB — CULTURE, BLOOD (ROUTINE X 2): Culture: NO GROWTH

## 2011-07-18 ENCOUNTER — Telehealth: Payer: Self-pay | Admitting: *Deleted

## 2011-07-18 ENCOUNTER — Ambulatory Visit (INDEPENDENT_AMBULATORY_CARE_PROVIDER_SITE_OTHER): Payer: Medicare Other | Admitting: Infectious Diseases

## 2011-07-18 ENCOUNTER — Encounter: Payer: Self-pay | Admitting: Infectious Diseases

## 2011-07-18 DIAGNOSIS — F329 Major depressive disorder, single episode, unspecified: Secondary | ICD-10-CM | POA: Insufficient documentation

## 2011-07-18 DIAGNOSIS — R7881 Bacteremia: Secondary | ICD-10-CM | POA: Insufficient documentation

## 2011-07-18 DIAGNOSIS — M869 Osteomyelitis, unspecified: Secondary | ICD-10-CM | POA: Insufficient documentation

## 2011-07-18 DIAGNOSIS — L089 Local infection of the skin and subcutaneous tissue, unspecified: Secondary | ICD-10-CM

## 2011-07-18 MED ORDER — SERTRALINE HCL 25 MG PO TABS: 25.0000 mg | ORAL_TABLET | Freq: Every day | ORAL | Status: DC

## 2011-07-18 NOTE — Telephone Encounter (Signed)
She uses [facility]  On [address]. I spoke with Tammy again. They are arranging her to go to [facility] to get the streptokinase as they no longer have a nurse available to administer it. Told her the md had given a verbal order to do this. Call back if any problems or questions

## 2011-07-18 NOTE — Telephone Encounter (Signed)
Tammy, a nurse with [facility] called to ask for orders to get the clot buster med. Pt's PICC is clogged in both lines. Dr Brock Bad the med. I will call it into the pharmacy when I know where to call it. I called Tammy back [phone] & told her above

## 2011-07-18 NOTE — Progress Notes (Signed)
  Subjective:    Patient ID: Annette Hernandez, female    DOB: [birth year], 70 y.o.   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 She returned to [facility] 06-29-11 to 07-07-11 with MSSA bacteremia and L THR infection. She underwent I & D of her L THR on 07-03-11. Was started on ANcef but had persistently elavated WBC so was changed to Vancomycin and had improvement of this. She also had n/v that was attributed to this which improved with the anbx change.  Pain in her hip is "2-3" getting better. Is walking using a walker, getting around well. No problems with PIC line- One port has a an occlusion. No fevers or chills. Has some complaints of fatigue. Feels like she is depressed- has been crying more, has had decreased apetite, feeling like not wanting visitors in house, has been sleeping poorly (even with 1/2 [place]). Is curious if anti-depressant would help. Denies feeling isolated. No feelings of suicidality.    Review of Systems Hot flashes    Objective:   Physical Exam  Constitutional: She appears well-developed and well-nourished.  Cardiovascular: Normal rate, regular rhythm and normal heart sounds.   Pulmonary/Chest: Effort normal and breath sounds normal.  Abdominal: Soft. Bowel sounds are normal. There is no tenderness.  Musculoskeletal:       Arms:      Legs:      Feet:  Psychiatric: Her mood appears anxious. Her affect is labile. She expresses no suicidal ideation.       Cries easily, denies SI          Assessment & Plan:

## 2011-07-18 NOTE — Assessment & Plan Note (Signed)
She will continue on her current anbx with plan for her to stop at Aug 30th and then change to PO anbx along with the rifampin. We will get her records of her WBC's to follow up. rtc 1 month

## 2011-07-18 NOTE — Assessment & Plan Note (Signed)
Will recheck her BCx after she completes her IV anbx.

## 2011-07-18 NOTE — Assessment & Plan Note (Signed)
Will start her on a low dose of SSRI today and i have encouraged her to do 3 things 1) eat well regardless of her apetite. 2) sleep as best she can 3) follow up with her PCP, even if she does not feel like it.

## 2011-07-25 ENCOUNTER — Telehealth: Payer: Self-pay | Admitting: *Deleted

## 2011-07-25 NOTE — Telephone Encounter (Signed)
rec'd voice mail from [place], a nurse with [place]. States the PICC is slow to push in & will not withdraw. Also reports wound on L shin is having yellow drainage with a black spot. Since she will not be available, she asked that I call Brennan Bailey , her nurse manager at [phone]. I did & left a voice mail. I called the pt. States "I don't know anything about the picc line".  She is concerned about her leg. Tol;d her I was too & wanted her to come see a doctor. Transferred to [facility] at front. Ok to put in first available

## 2011-07-26 ENCOUNTER — Telehealth: Payer: Self-pay | Admitting: *Deleted

## 2011-07-26 ENCOUNTER — Encounter: Payer: Self-pay | Admitting: Internal Medicine

## 2011-07-26 ENCOUNTER — Ambulatory Visit (HOSPITAL_COMMUNITY): Payer: Medicare Other | Attending: Infectious Diseases

## 2011-07-26 ENCOUNTER — Ambulatory Visit (INDEPENDENT_AMBULATORY_CARE_PROVIDER_SITE_OTHER): Payer: Medicare Other | Admitting: Internal Medicine

## 2011-07-26 VITALS — BP 129/75 | HR 85 | Temp 98.0°F | Ht 65.5 in | Wt 119.0 lb

## 2011-07-26 DIAGNOSIS — Z96649 Presence of unspecified artificial hip joint: Secondary | ICD-10-CM | POA: Insufficient documentation

## 2011-07-26 DIAGNOSIS — A4902 Methicillin resistant Staphylococcus aureus infection, unspecified site: Secondary | ICD-10-CM | POA: Insufficient documentation

## 2011-07-26 DIAGNOSIS — M869 Osteomyelitis, unspecified: Secondary | ICD-10-CM

## 2011-07-26 DIAGNOSIS — M009 Pyogenic arthritis, unspecified: Secondary | ICD-10-CM

## 2011-07-26 DIAGNOSIS — Z452 Encounter for adjustment and management of vascular access device: Secondary | ICD-10-CM | POA: Insufficient documentation

## 2011-07-26 MED ORDER — RIFAMPIN 300 MG PO CAPS: 600.0000 mg | ORAL_CAPSULE | Freq: Every day | ORAL | Status: DC

## 2011-07-26 NOTE — Telephone Encounter (Signed)
Called [facility] and spoke to [place], gave order for wound care for her left lower leg. Wendall Mola CMA

## 2011-07-26 NOTE — Assessment & Plan Note (Signed)
Lesion on leg does not appear to be acutely infected.  I have sent off a culture in case anything changes.  I have recommended with the home health to do wound care to the area.  To follow up around august 30 at the end of treatment for consideration of suppressive antibiotics.    Also, her PICC line has not been drawing back and so orders were given for TPA.

## 2011-07-26 NOTE — Progress Notes (Signed)
  Subjective:    Patient ID: Annette Hernandez, female    DOB: 1941/02/06, 70 y.o.   MRN: [id number] HPI she is here today at the request of the home health due to her lesion on her leg.  She had previously had a scab and this was removed during her hospitalization.  It began to heal but it was noted to have a "black spot" in the middle, noted by home health.  The patient states there has been some drainage that is yellow in color, though she thinks that it is the same color as the bacitracin she has been applying.  She reports no fever and no pain at the area.      Review of Systems  All other systems reviewed and are negative.       Objective:   Physical Exam  Cardiovascular: Normal rate, regular rhythm and normal heart sounds.   No murmur heard. Pulmonary/Chest: Effort normal and breath sounds normal. No respiratory distress.  Abdominal: Soft. Bowel sounds are normal. She exhibits no distension.  Skin:       + left 1 cm lesion on shin, good granulation tissue, no warmth, no drainage, non-tender          Assessment & Plan:

## 2011-07-26 NOTE — Telephone Encounter (Signed)
Unable to reach [place], Charity fundraiser at [place]. Her phone gave a disconnected message.  The Best boy never returned my call & today she is not in. I spoke with Arline Asp, RN from [facility]. Told her what the problem was & she told me she will get this handled & call me back. 5 minutes later I got a call from [place], rn at [facility]. Pt is to go there now. Dr. Luciana Axe signed the order & II faxed it back. Cindy then called back to make sure things had been done. Told her pt was on her way there now & thanked her for her prompt assistance.

## 2011-07-26 NOTE — Telephone Encounter (Signed)
Annette Hernandez from [facility] called to report the ports are now patent. She has instructed them how to flush the red port on m-w-f. They have supplies at home. The antibiotics use the purple port
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
## 2011-07-29 LAB — WOUND CULTURE
Gram Stain: NONE SEEN
Gram Stain: NONE SEEN

## 2011-08-13 ENCOUNTER — Other Ambulatory Visit: Payer: Self-pay

## 2011-08-13 DIAGNOSIS — M009 Pyogenic arthritis, unspecified: Secondary | ICD-10-CM

## 2011-08-13 NOTE — Telephone Encounter (Signed)
Pt called to assure she is to continue the rifampin after the PICC is pulled. According to Dr Ephriam Knuckles last note she is to continue with this med. Pt states she will need a refill.  We will call in one month's supply and receive the others when she come for OV in Sept.  Meds called to [facility] on [address]  Tomasita Morrow

## 2011-08-15 ENCOUNTER — Telehealth: Payer: Self-pay | Admitting: *Deleted

## 2011-08-15 DIAGNOSIS — M009 Pyogenic arthritis, unspecified: Secondary | ICD-10-CM

## 2011-08-15 NOTE — Telephone Encounter (Signed)
She has f/u 08/23/11. Wants to know if she is to continue the rifampin? If so she wants to use [facility]. Is there another antibiotic she is to take?[facility] [address]. [phone]

## 2011-08-16 MED ORDER — RIFAMPIN 300 MG PO CAPS
600.0000 mg | ORAL_CAPSULE | Freq: Every day | ORAL | Status: DC
Start: 2011-08-16 — End: 2011-09-13

## 2011-08-16 NOTE — Telephone Encounter (Signed)
It looks like Dr. Ninetta Lights was going to start her on something else po in addition to the rifampin once her treatment was completed today.  You might check with him.  Otherwise, she could continue with the vancomycin + rifampin until she sees him.

## 2011-08-16 NOTE — Telephone Encounter (Signed)
She should continue with Rifampin until her appointment.  Thanks

## 2011-08-22 ENCOUNTER — Encounter: Payer: Self-pay | Admitting: Infectious Diseases

## 2011-08-23 ENCOUNTER — Ambulatory Visit (INDEPENDENT_AMBULATORY_CARE_PROVIDER_SITE_OTHER): Payer: Medicare Other | Admitting: Internal Medicine

## 2011-08-23 ENCOUNTER — Encounter: Payer: Self-pay | Admitting: Internal Medicine

## 2011-08-23 VITALS — BP 132/68 | HR 84 | Temp 97.9°F | Ht 65.0 in | Wt 114.0 lb

## 2011-08-23 DIAGNOSIS — M869 Osteomyelitis, unspecified: Secondary | ICD-10-CM

## 2011-08-23 DIAGNOSIS — T8450XA Infection and inflammatory reaction due to unspecified internal joint prosthesis, initial encounter: Secondary | ICD-10-CM

## 2011-08-23 MED ORDER — SULFAMETHOXAZOLE-TMP DS 800-160 MG PO TABS: 1.0000 | ORAL_TABLET | Freq: Two times a day (BID) | ORAL | Status: DC

## 2011-08-23 NOTE — Progress Notes (Signed)
  Subjective:    Patient ID: Annette Hernandez, female    DOB: 1941-12-13, 70 y.o.   MRN: [id number]  HPI here for follow up after having completed 6 weeks of treatment with vancomycin and rifampin following an MSSA PJI.  She had initially started on Ancef though WBC did not improve as expected and was switched to vancomycin with good resolution.  Today she reports no fever, chills, increase in pain of her hip, just fatigue.  She is concerned about getting a full supply of refills if she is to continue on suppressive therapy.      Review of Systems  Constitutional: Positive for fatigue. Negative for fever, chills, appetite change and unexpected weight change.  HENT: Negative.   Eyes: Negative.   Respiratory: Negative.   Cardiovascular: Negative.   Gastrointestinal: Negative.   Musculoskeletal: Negative.   Skin: Negative.   Neurological: Negative.   Hematological: Negative.   Psychiatric/Behavioral: Negative.        Objective:   Physical Exam  Constitutional: She appears well-developed and well-nourished.  Cardiovascular: Normal rate, regular rhythm and normal heart sounds.   No murmur heard. Pulmonary/Chest: Effort normal and breath sounds normal. She has no wheezes.  Abdominal: Soft. Bowel sounds are normal. There is no tenderness.  Lymphadenopathy:    She has no cervical adenopathy.  Skin: Skin is warm and dry. No erythema.  Psychiatric:       Mildly anxious          Assessment & Plan:

## 2011-08-23 NOTE — Assessment & Plan Note (Signed)
She will continue with po suppressive therapy with Rifampin and Bactrim.  Side effects described and she will follow up with Dr. Ninetta Lights in 2-3 months to assure tolerance of her antibioitcs and to discuss duration further.

## 2011-09-06 ENCOUNTER — Ambulatory Visit: Payer: Medicare Other | Attending: Orthopedic Surgery | Admitting: Physical Therapy

## 2011-09-06 DIAGNOSIS — M25559 Pain in unspecified hip: Secondary | ICD-10-CM | POA: Insufficient documentation

## 2011-09-06 DIAGNOSIS — IMO0001 Reserved for inherently not codable concepts without codable children: Secondary | ICD-10-CM | POA: Insufficient documentation

## 2011-09-06 DIAGNOSIS — R269 Unspecified abnormalities of gait and mobility: Secondary | ICD-10-CM | POA: Insufficient documentation

## 2011-09-11 ENCOUNTER — Encounter: Payer: Self-pay | Admitting: Internal Medicine

## 2011-09-11 ENCOUNTER — Ambulatory Visit: Payer: Medicare Other | Admitting: Physical Therapy

## 2011-09-13 ENCOUNTER — Ambulatory Visit: Payer: Medicare Other | Admitting: Physical Therapy

## 2011-09-13 ENCOUNTER — Other Ambulatory Visit: Payer: Self-pay | Admitting: Internal Medicine

## 2011-09-13 DIAGNOSIS — B20 Human immunodeficiency virus [HIV] disease: Secondary | ICD-10-CM

## 2011-09-18 ENCOUNTER — Ambulatory Visit: Payer: Medicare Other | Attending: Orthopedic Surgery | Admitting: Physical Therapy

## 2011-09-18 DIAGNOSIS — M25559 Pain in unspecified hip: Secondary | ICD-10-CM | POA: Insufficient documentation

## 2011-09-18 DIAGNOSIS — R269 Unspecified abnormalities of gait and mobility: Secondary | ICD-10-CM | POA: Insufficient documentation

## 2011-09-18 DIAGNOSIS — IMO0001 Reserved for inherently not codable concepts without codable children: Secondary | ICD-10-CM | POA: Insufficient documentation

## 2011-09-20 ENCOUNTER — Ambulatory Visit: Payer: Medicare Other | Admitting: Physical Therapy

## 2011-09-21 ENCOUNTER — Ambulatory Visit (INDEPENDENT_AMBULATORY_CARE_PROVIDER_SITE_OTHER): Payer: Medicare Other | Admitting: Infectious Diseases

## 2011-09-21 ENCOUNTER — Encounter: Payer: Self-pay | Admitting: Infectious Diseases

## 2011-09-21 DIAGNOSIS — M869 Osteomyelitis, unspecified: Secondary | ICD-10-CM

## 2011-09-21 DIAGNOSIS — R634 Abnormal weight loss: Secondary | ICD-10-CM | POA: Insufficient documentation

## 2011-09-21 DIAGNOSIS — B20 Human immunodeficiency virus [HIV] disease: Secondary | ICD-10-CM

## 2011-09-21 MED ORDER — RIFAMPIN 300 MG PO CAPS: 600.0000 mg | ORAL_CAPSULE | Freq: Every day | ORAL | Status: DC

## 2011-09-21 NOTE — Assessment & Plan Note (Signed)
Suggested she try nutritional supplements such as ensure or carnation instant breakfast. Also suggested that she eat her full, regular meal and after each meal drink 8 oz of skim milk.

## 2011-09-21 NOTE — Assessment & Plan Note (Signed)
She is doing very well. Will continue her po antibiotics for a total of 6 months. This is a somewhat shorter course however she did not seem enthused for a longer course and she had a definite focus that lead to her infection (ie not an occult or indolent prosthetic infection). She will Return to clinic if she has worsening pain, fever/chills, erythema.
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
## 2011-09-24 NOTE — Telephone Encounter (Signed)
This question has already been answered correct?

## 2011-09-25 ENCOUNTER — Ambulatory Visit: Payer: Medicare Other | Admitting: Physical Therapy

## 2011-09-26 LAB — URINALYSIS, ROUTINE W REFLEX MICROSCOPIC
Bilirubin Urine: NEGATIVE
Specific Gravity, Urine: 1.02
Urobilinogen, UA: 0.2
pH: 6

## 2011-09-26 LAB — COMPREHENSIVE METABOLIC PANEL
ALT: 13
AST: 18
Albumin: 3.5
Alkaline Phosphatase: 58
Chloride: 102
GFR calc Af Amer: >60
Potassium: 4
Sodium: 139
Total Bilirubin: 0.7
Total Protein: 5.9 — ABNORMAL LOW

## 2011-09-26 LAB — CBC
HCT: 25.7 — ABNORMAL LOW
HCT: 26.3 — ABNORMAL LOW
Hemoglobin: 8.8 — ABNORMAL LOW
Hemoglobin: 8.9 — ABNORMAL LOW
MCHC: 33.9
MCHC: 34.4
MCHC: 35
MCV: 90.8
MCV: 92.2
Platelets: 218
Platelets: 240
Platelets: 258
Platelets: 313
RBC: 2.85 — ABNORMAL LOW
RDW: 12.8
RDW: 13
RDW: 13.2
RDW: 13.5
WBC: 13.4 — ABNORMAL HIGH
WBC: 7.3

## 2011-09-26 LAB — BASIC METABOLIC PANEL
BUN: 3 — ABNORMAL LOW
BUN: 7
BUN: 7
CO2: 26
CO2: 28
Calcium: 8.1 — ABNORMAL LOW
Calcium: 8.6
Chloride: 101
Creatinine, Ser: 0.64
Creatinine, Ser: 0.7
GFR calc non Af Amer: >60
Glucose, Bld: 116 — ABNORMAL HIGH
Glucose, Bld: 130 — ABNORMAL HIGH
Potassium: 3.8
Sodium: 132 — ABNORMAL LOW

## 2011-09-26 LAB — URINE MICROSCOPIC-ADD ON

## 2011-09-26 LAB — TYPE AND SCREEN: Antibody Screen: NEGATIVE

## 2011-09-26 LAB — PROTIME-INR
INR: 1
Prothrombin Time: 20.2 — ABNORMAL HIGH
Prothrombin Time: 21.4 — ABNORMAL HIGH

## 2011-09-27 ENCOUNTER — Ambulatory Visit: Payer: Medicare Other | Admitting: Physical Therapy

## 2011-10-02 ENCOUNTER — Encounter: Payer: Medicare Other | Admitting: Physical Therapy

## 2011-10-04 ENCOUNTER — Encounter: Payer: Medicare Other | Admitting: Physical Therapy

## 2012-06-17 ENCOUNTER — Other Ambulatory Visit: Payer: Self-pay | Admitting: Licensed Clinical Social Worker

## 2012-06-17 DIAGNOSIS — F329 Major depressive disorder, single episode, unspecified: Secondary | ICD-10-CM

## 2012-06-17 MED ORDER — SERTRALINE HCL 25 MG PO TABS: 25.0000 mg | ORAL_TABLET | Freq: Every day | ORAL | Status: DC

## 2012-07-07 ENCOUNTER — Other Ambulatory Visit: Payer: Self-pay | Admitting: Family Medicine

## 2012-07-07 DIAGNOSIS — Z1231 Encounter for screening mammogram for malignant neoplasm of breast: Secondary | ICD-10-CM

## 2012-07-22 ENCOUNTER — Ambulatory Visit
Admission: RE | Admit: 2012-07-22 | Discharge: 2012-07-22 | Disposition: A | Discharge status: Home / Self Care | Payer: Medicare Other | Source: Ambulatory Visit | Attending: Family Medicine | Admitting: Family Medicine

## 2012-07-22 DIAGNOSIS — Z1231 Encounter for screening mammogram for malignant neoplasm of breast: Secondary | ICD-10-CM

## 2012-08-26 ENCOUNTER — Other Ambulatory Visit: Payer: Self-pay | Admitting: Orthopedic Surgery

## 2012-08-26 DIAGNOSIS — M889 Osteitis deformans of unspecified bone: Secondary | ICD-10-CM

## 2012-08-27 ENCOUNTER — Ambulatory Visit
Admission: RE | Admit: 2012-08-27 | Discharge: 2012-08-27 | Disposition: A | Discharge status: Home / Self Care | Payer: Medicare Other | Source: Ambulatory Visit | Attending: Orthopedic Surgery | Admitting: Orthopedic Surgery

## 2012-08-27 DIAGNOSIS — M889 Osteitis deformans of unspecified bone: Secondary | ICD-10-CM

## 2013-04-17 ENCOUNTER — Emergency Department (HOSPITAL_COMMUNITY): Payer: Medicare Other

## 2013-04-17 ENCOUNTER — Emergency Department (HOSPITAL_COMMUNITY)
Admission: EM | Admit: 2013-04-17 | Discharge: 2013-04-17 | Disposition: A | Discharge status: Home / Self Care | Payer: Medicare Other | Attending: Emergency Medicine | Admitting: Emergency Medicine

## 2013-04-17 ENCOUNTER — Encounter (HOSPITAL_COMMUNITY): Payer: Self-pay | Admitting: Cardiology

## 2013-04-17 DIAGNOSIS — S0993XA Unspecified injury of face, initial encounter: Secondary | ICD-10-CM | POA: Insufficient documentation

## 2013-04-17 DIAGNOSIS — R112 Nausea with vomiting, unspecified: Secondary | ICD-10-CM | POA: Insufficient documentation

## 2013-04-17 DIAGNOSIS — Y9301 Activity, walking, marching and hiking: Secondary | ICD-10-CM | POA: Insufficient documentation

## 2013-04-17 DIAGNOSIS — S0230XA Fracture of orbital floor, unspecified side, initial encounter for closed fracture: Secondary | ICD-10-CM | POA: Insufficient documentation

## 2013-04-17 DIAGNOSIS — S0990XA Unspecified injury of head, initial encounter: Secondary | ICD-10-CM | POA: Insufficient documentation

## 2013-04-17 DIAGNOSIS — I10 Essential (primary) hypertension: Secondary | ICD-10-CM | POA: Insufficient documentation

## 2013-04-17 DIAGNOSIS — W1809XA Striking against other object with subsequent fall, initial encounter: Secondary | ICD-10-CM | POA: Insufficient documentation

## 2013-04-17 DIAGNOSIS — S199XXA Unspecified injury of neck, initial encounter: Secondary | ICD-10-CM | POA: Insufficient documentation

## 2013-04-17 DIAGNOSIS — Z79899 Other long term (current) drug therapy: Secondary | ICD-10-CM | POA: Insufficient documentation

## 2013-04-17 DIAGNOSIS — S023XXA Fracture of orbital floor, initial encounter for closed fracture: Secondary | ICD-10-CM

## 2013-04-17 DIAGNOSIS — W19XXXA Unspecified fall, initial encounter: Secondary | ICD-10-CM

## 2013-04-17 DIAGNOSIS — Y929 Unspecified place or not applicable: Secondary | ICD-10-CM | POA: Insufficient documentation

## 2013-04-17 HISTORY — DX: Essential (primary) hypertension: I10

## 2013-04-17 MED ORDER — ONDANSETRON HCL 4 MG PO TABS: 4.0000 mg | ORAL_TABLET | Freq: Three times a day (TID) | ORAL | Status: DC | PRN

## 2013-04-17 MED ORDER — MORPHINE SULFATE 2 MG/ML IJ SOLN
INTRAMUSCULAR | Status: AC
  Administered 2013-04-17: 2 mg via INTRAVENOUS
  Filled 2013-04-17: qty 1

## 2013-04-17 MED ORDER — HYDROMORPHONE HCL PF 1 MG/ML IJ SOLN
1.0000 mg | Freq: Once | INTRAMUSCULAR | Status: AC
  Administered 2013-04-17: 1 mg via INTRAVENOUS
  Filled 2013-04-17: qty 1

## 2013-04-17 MED ORDER — ONDANSETRON HCL 4 MG/2ML IJ SOLN
INTRAMUSCULAR | Status: AC
  Administered 2013-04-17: 4 mg via INTRAVENOUS
  Filled 2013-04-17: qty 2

## 2013-04-17 MED ORDER — ONDANSETRON HCL 4 MG/2ML IJ SOLN
4.0000 mg | Freq: Once | INTRAMUSCULAR | Status: AC
  Administered 2013-04-17: 4 mg via INTRAVENOUS

## 2013-04-17 MED ORDER — LORAZEPAM 2 MG/ML IJ SOLN
0.5000 mg | Freq: Once | INTRAMUSCULAR | Status: AC
  Administered 2013-04-17: 0.5 mg via INTRAVENOUS
  Filled 2013-04-17: qty 1

## 2013-04-17 MED ORDER — CEPHALEXIN 500 MG PO CAPS: 500.0000 mg | ORAL_CAPSULE | Freq: Two times a day (BID) | ORAL | Status: DC

## 2013-04-17 MED ORDER — ONDANSETRON HCL 4 MG/2ML IJ SOLN
4.0000 mg | Freq: Once | INTRAMUSCULAR | Status: AC
  Administered 2013-04-17: 4 mg via INTRAVENOUS
  Filled 2013-04-17: qty 2

## 2013-04-17 MED ORDER — OXYCODONE-ACETAMINOPHEN 5-325 MG PO TABS: 1.0000 | ORAL_TABLET | ORAL | Status: DC | PRN

## 2013-04-17 NOTE — ED Provider Notes (Signed)
History     CSN: [id number]  Arrival date & time 04/17/13  1300   First MD Initiated Contact with Patient 04/17/13 1304      Chief Complaint  Patient presents with  . Fall  . Head Laceration    (Consider location/radiation/quality/duration/timing/severity/associated sxs/prior treatment) Patient is a 72 y.o. female presenting with fall and scalp laceration.  Fall Associated symptoms include nausea and vomiting. Pertinent negatives include no abdominal pain.  Head Laceration Associated symptoms include nausea, neck pain and vomiting. Pertinent negatives include no abdominal pain.   72 year old female presents to the emergency department for fall.  Patient states that she was walking when she lost her footing and fell onto her face.  She states that she must have lost consciousness because she does not remember what happened after that.  She complains of severe pain in her head.  He vomited once the way to the emergency department here she is not on any blood thinners.  Patient arrived on a spinal precautions including c-collar and long spinal board.   Past Medical History  Diagnosis Date  . Hypertension     History reviewed. No pertinent past surgical history.  Family History  Problem Relation Age of Onset  . Alzheimer's disease Father     History  Substance Use Topics  . Smoking status: Never Smoker   . Smokeless tobacco: Not on file  . Alcohol Use: No    OB History   Grav Para Term Preterm Abortions TAB SAB Ect Mult Living                  Review of Systems  HENT: Positive for neck pain. Negative for neck stiffness.   Eyes: Positive for pain.  Respiratory: Negative for shortness of breath.   Gastrointestinal: Positive for nausea and vomiting. Negative for abdominal pain.  Skin: Positive for wound.  Neurological: Negative for syncope.    Allergies  Review of patient's allergies indicates no known allergies.  Home Medications   Current Outpatient Rx  Name   Route  Sig  Dispense  Refill  . calcium carbonate (TUMS - DOSED IN MG ELEMENTAL CALCIUM) 500 MG chewable tablet   Oral   Chew 1 tablet by mouth daily.         Marland Kitchen losartan (COZAAR) 100 MG tablet   Oral   Take 100 mg by mouth daily.         . Multiple Vitamin (MULTIVITAMIN WITH MINERALS) TABS   Oral   Take 1 tablet by mouth daily.         Marland Kitchen zolpidem (AMBIEN) 10 MG tablet   Oral   Take 5 mg by mouth at bedtime as needed for sleep.           BP 139/58  Pulse 76  Resp 28  SpO2 98%  Physical Exam  Constitutional: She appears well-developed and well-nourished. No distress.  HENT:  Head: Normocephalic.  No hematomas lacerations to the scalp.   Eyes:    Patient with bruising and swelling of the left eye.  There is a 5 cm laceration through  Eyebrow.  Significant swelling and ecchymosis.   Pupils equal round and reactive to light.  Musculoskeletal:  Abrasion over the rLeft knee. Pain to palpation. C/O     ED Course  Procedures (including critical care time)  Labs Reviewed - No data to display Ct Head Wo Contrast  04/17/2013  *RADIOLOGY REPORT*  Clinical Data:  Larey Seat and hit head. Scalp laceration.  Headache. Neck pain.  Vomiting.  CT HEAD WITHOUT CONTRAST CT CERVICAL SPINE WITHOUT CONTRAST  Technique:  Multidetector CT imaging of the head and cervical spine was performed following the standard protocol without intravenous contrast.  Multiplanar CT image reconstructions of the cervical spine were also generated.  Comparison:  CT head 06/29/2011.  CT HEAD  Findings: There is no evidence for acute infarction, intracranial hemorrhage, mass lesion, hydrocephalus, or extra-axial fluid.  Mild atrophy.  No significant white matter disease.  Calvarium intact. Clear sinuses and mastoids.  Vascular calcification.  There appears to be mild left preseptal periorbital soft tissue swelling.  In addition, there is a left frontal scalp hematoma/laceration without visible radiopaque foreign  body.  The underlying calvarium is intact.  Incompletely evaluated on this CT head examination is what appears to be orbital floor depression on the left.  The maxillary sinus is filled with fluid, and there is a possible left medial maxillary wall fracture.  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  CT maxillofacial recommended for further evaluation.  IMPRESSION: Left frontal scalp hematoma/laceration.  No skull fracture or intracranial hemorrhage.  Suspected inferior orbital blowout fracture on the left.  CT maxillofacial recommended for further evaluation.  CT CERVICAL SPINE  Findings: There is no visible cervical spine fracture or traumatic subluxation. No intraspinal hematoma or prevertebral soft tissue swelling.  C4-C6 fusion. Mild facet mediated slip C3-C4.  Mild uncinate spurring at C5-6 on the left.  Carotid calcification.  No neck masses.  Lung apices show no pneumothorax.  The airway is midline.  IMPRESSION: Cervical spondylosis.  No acute findings.   Original Report Authenticated By: Davonna Belling, M.D.    Ct Cervical Spine Wo Contrast  04/17/2013  *RADIOLOGY REPORT*  Clinical Data:  Larey Seat and hit head. Scalp laceration.  Headache. Neck pain.  Vomiting.  CT HEAD WITHOUT CONTRAST CT CERVICAL SPINE WITHOUT CONTRAST  Technique:  Multidetector CT imaging of the head and cervical spine was performed following the standard protocol without intravenous contrast.  Multiplanar CT image reconstructions of the cervical spine were also generated.  Comparison:  CT head 06/29/2011.  CT HEAD  Findings: There is no evidence for acute infarction, intracranial hemorrhage, mass lesion, hydrocephalus, or extra-axial fluid.  Mild atrophy.  No significant white matter disease.  Calvarium intact. Clear sinuses and mastoids.  Vascular calcification.  There appears to be mild left preseptal periorbital soft tissue swelling.  In addition, there is a left frontal scalp  hematoma/laceration without visible radiopaque foreign body.  The underlying calvarium is intact.  Incompletely evaluated on this CT head examination is what appears to be orbital floor depression on the left.  The maxillary sinus is filled with fluid, and there is a possible left medial maxillary wall fracture.  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  CT maxillofacial recommended for further evaluation.  IMPRESSION: Left frontal scalp hematoma/laceration.  No skull fracture or intracranial hemorrhage.  Suspected inferior orbital blowout fracture on the left.  CT maxillofacial recommended for further evaluation.  CT CERVICAL SPINE  Findings: There is no visible cervical spine fracture or traumatic subluxation. No intraspinal hematoma or prevertebral soft tissue swelling.  C4-C6 fusion. Mild facet mediated slip C3-C4.  Mild uncinate spurring at C5-6 on the left.  Carotid calcification.  No neck masses.  Lung apices show no pneumothorax.  The airway is midline.  IMPRESSION: Cervical spondylosis.  No acute findings.   Original  Report Authenticated By: Davonna Belling, M.D.    Dg Shoulder Left  04/17/2013  *RADIOLOGY REPORT*  Clinical Data: Fall, left shoulder pain  LEFT SHOULDER - 2+ VIEW  Comparison: None.  Findings: Left lung apex is clear.  No fracture or dislocation. Mild central compression deformities are noted at the upper thoracic spine.  IMPRESSION: No left shoulder fracture or dislocation.  Age indeterminate mild central compression deformities of several thoracic vertebral bodies.   Original Report Authenticated By: Christiana Pellant, M.D.    Dg Knee Complete 4 Views Left  04/17/2013  *RADIOLOGY REPORT*  Clinical Data: Fall, left knee pain and laceration  LEFT KNEE - COMPLETE 4+ VIEW  Comparison: None.  Findings: No fracture or dislocation.  No soft tissue abnormality. No radiopaque foreign body.  No suprapatellar effusion.  IMPRESSION: Normal exam.    Original Report Authenticated By: Christiana Pellant, M.D.    Ct Maxillofacial Wo Cm  04/17/2013  *RADIOLOGY REPORT*  Clinical Data: 72 year old female status post fall.  Head laceration.  Nausea.  CT MAXILLOFACIAL WITHOUT CONTRAST  Technique:  Multidetector CT imaging of the maxillofacial structures was performed. Multiplanar CT image reconstructions were also generated.  Comparison: CT head and cervical spine from the same day.  Findings: Negative visualized brain parenchyma. Calcified atherosclerosis at the skull base.  Very mild retromaxillary stranding on the left.  Except for this and orbit soft tissues, other visualized deep soft tissue spaces of the face are within normal limits.  The right orbit soft tissues are normal.  Comminuted left orbital floor fracture with herniated fat.  The left inferior rectus muscle appears mildly herniated posteriorly but more anteriorly is less affected.  Layering hemorrhage within the left maxillary sinus.  The fracture extends to the course of the left infraorbital nerve.  Asymmetric preseptal soft tissue thickening.  Left globe intact.  Mild left intraorbital hematoma.  No nasal bone fracture.  Left zygoma intact.  Left lamina papyracea intact.  Paranasal sinuses other than the left maxillary sinus are clear.  Mandible intact.  IMPRESSION: 1.  Confirmed left orbital floor fracture, comminuted, with some herniated fat and mildly or partially herniated inferior rectus muscle. Mild left postbulbar hematoma. 2.  Hemorrhage layering in the left maxillary sinus. Mild left premalar and retromaxillary fat stranding/contusion. 3.  No other acute facial fracture or injury identified.   Original Report Authenticated By: Erskine Speed, M.D.      1. Fall, initial encounter   2. Orbital floor fracture, closed, initial encounter       MDM  Patient c/o sever pain and nause. CT scans negative, however radiaoloy saw fluid in the sinus and asked for CT maxilofacial. Patient has  tenderness arounf the eye. She is barely able to keep the eye open. She cannot look up without pain.     4:56 PM I have spoken with Dr. Allyne Gee of ophthalmology who will see the patient in f/u . Dr. Suszanne Conners reviewed the CT scan and feels that the patient does no have entrapment . He has asked that we repair the lacerations and have her follow up on Monday in his office. The patient will be discharged with keflex, percocet, and zofran.   Dr. Berline Chough has repaired the lacerations. Please refer to his note.       Arthor Captain, PA-C 04/17/13 1700

## 2013-04-17 NOTE — ED Notes (Signed)
Pt returned from radiology.

## 2013-04-17 NOTE — ED Notes (Signed)
Return to xray

## 2013-04-17 NOTE — ED Notes (Signed)
Pt back from xray daughter at bedside

## 2013-04-17 NOTE — ED Notes (Signed)
Has ~ 4 inch lac deep to left eye brow, pt vomited small amt c collar remains on

## 2013-04-17 NOTE — ED Notes (Signed)
Pt to department via EMS from home- pt reports she was out shopping and tripped. Pt hit her forehead on the concrete. Pt with laceration to the left area above eyebrow. Pt started vomiting on the way to department. Pt given 8mg  of Zofran. Pt on lsb and in c-collar. 20g left hand. Bp-162/84 Hr-90

## 2013-04-17 NOTE — ED Provider Notes (Signed)
I saw and evaluated the patient, reviewed the resident's note and I agree with the findings and plan.   .Face to face Exam:  General:  Awake HEENT: Lacerations as noted Resp:  Normal effort Abd:  Nondistended Neuro:No focal weakness Lymph: No adenopathy   Nelia Shi, MD 04/17/13 Windell Moment

## 2013-04-17 NOTE — ED Provider Notes (Signed)
LACERATION REPAIR Performed by: Andrena Mews, DO Authorized by: Nelva Nay, MD Consent: Verbal consent obtained. Risks and benefits: risks, benefits and alternatives were discussed Consent given by: patient Patient identity confirmed: provided demographic data Prepped and Draped in normal sterile fashion Wound explored  Laceration Location:   A - Left Supraorbital margin.   B - Left Infraorbital margin  C - Left lateral margin of Glabella superior to A  D - Right Glabella  Laceration Length:   A - 5 cm  B - 3 cm (superficial; 1cm complete thickness)  C - 1cm  D - 1cm   No Foreign Bodies seen or palpated  Anesthesia: local infiltration  Local anesthetic: lidocaine 2% with epinephrine  Anesthetic total: 8 ml  Irrigation method: syringe Amount of cleaning: standard  Skin closure: 6-O prolene interupted with simple and and vertical mattress  Number of sutures: 20 total  A - 13  B - 3  C - 2  D - 2  Technique: Sterile  Patient tolerance: Patient tolerated the procedure well with no immediate complications.  Andrena Mews, DO [facility] Resident - PGY-2 04/17/2013 6:39 PM    Andrena Mews, DO 04/17/13 [phone]

## 2013-04-17 NOTE — ED Provider Notes (Signed)
Medical screening examination/treatment/procedure(s) were conducted as a shared visit with non-physician practitioner(s) and myself.  I personally evaluated the patient during the encounter    Nelia Shi, MD 04/17/13 [phone]

## 2013-09-07 ENCOUNTER — Other Ambulatory Visit: Payer: Self-pay

## 2013-09-07 DIAGNOSIS — Z1231 Encounter for screening mammogram for malignant neoplasm of breast: Secondary | ICD-10-CM

## 2013-09-30 ENCOUNTER — Ambulatory Visit
Admission: RE | Admit: 2013-09-30 | Discharge: 2013-09-30 | Disposition: A | Discharge status: Home / Self Care | Payer: Medicare Other | Source: Ambulatory Visit

## 2013-09-30 DIAGNOSIS — Z1231 Encounter for screening mammogram for malignant neoplasm of breast: Secondary | ICD-10-CM

## 2014-10-15 ENCOUNTER — Other Ambulatory Visit: Payer: Self-pay

## 2014-10-15 DIAGNOSIS — Z1231 Encounter for screening mammogram for malignant neoplasm of breast: Secondary | ICD-10-CM

## 2014-10-29 ENCOUNTER — Encounter (INDEPENDENT_AMBULATORY_CARE_PROVIDER_SITE_OTHER): Payer: Self-pay

## 2014-10-29 ENCOUNTER — Ambulatory Visit
Admission: RE | Admit: 2014-10-29 | Discharge: 2014-10-29 | Disposition: A | Discharge status: Home / Self Care | Payer: Commercial Managed Care - HMO | Source: Ambulatory Visit

## 2014-10-29 DIAGNOSIS — Z1231 Encounter for screening mammogram for malignant neoplasm of breast: Secondary | ICD-10-CM

## 2014-12-30 DIAGNOSIS — L821 Other seborrheic keratosis: Secondary | ICD-10-CM | POA: Diagnosis not present

## 2014-12-30 DIAGNOSIS — L570 Actinic keratosis: Secondary | ICD-10-CM | POA: Diagnosis not present

## 2014-12-30 DIAGNOSIS — Z85828 Personal history of other malignant neoplasm of skin: Secondary | ICD-10-CM | POA: Diagnosis not present

## 2014-12-30 DIAGNOSIS — L812 Freckles: Secondary | ICD-10-CM | POA: Diagnosis not present

## 2015-04-19 DIAGNOSIS — N3000 Acute cystitis without hematuria: Secondary | ICD-10-CM | POA: Diagnosis not present

## 2015-04-19 DIAGNOSIS — I10 Essential (primary) hypertension: Secondary | ICD-10-CM | POA: Diagnosis not present

## 2015-04-29 DIAGNOSIS — I10 Essential (primary) hypertension: Secondary | ICD-10-CM | POA: Diagnosis not present

## 2015-05-10 DIAGNOSIS — R350 Frequency of micturition: Secondary | ICD-10-CM | POA: Diagnosis not present

## 2015-05-10 DIAGNOSIS — N3000 Acute cystitis without hematuria: Secondary | ICD-10-CM | POA: Diagnosis not present

## 2015-05-17 DIAGNOSIS — R350 Frequency of micturition: Secondary | ICD-10-CM | POA: Diagnosis not present

## 2015-06-01 DIAGNOSIS — R312 Other microscopic hematuria: Secondary | ICD-10-CM | POA: Diagnosis not present

## 2015-06-01 DIAGNOSIS — N393 Stress incontinence (female) (male): Secondary | ICD-10-CM | POA: Diagnosis not present

## 2015-06-01 DIAGNOSIS — R3915 Urgency of urination: Secondary | ICD-10-CM | POA: Diagnosis not present

## 2015-06-01 DIAGNOSIS — R3989 Other symptoms and signs involving the genitourinary system: Secondary | ICD-10-CM | POA: Diagnosis not present

## 2015-06-14 DIAGNOSIS — R312 Other microscopic hematuria: Secondary | ICD-10-CM | POA: Diagnosis not present

## 2015-06-14 DIAGNOSIS — N952 Postmenopausal atrophic vaginitis: Secondary | ICD-10-CM | POA: Diagnosis not present

## 2015-06-14 DIAGNOSIS — R3989 Other symptoms and signs involving the genitourinary system: Secondary | ICD-10-CM | POA: Diagnosis not present

## 2015-06-22 DIAGNOSIS — Z1389 Encounter for screening for other disorder: Secondary | ICD-10-CM | POA: Diagnosis not present

## 2015-06-22 DIAGNOSIS — G4700 Insomnia, unspecified: Secondary | ICD-10-CM | POA: Diagnosis not present

## 2015-06-22 DIAGNOSIS — R312 Other microscopic hematuria: Secondary | ICD-10-CM | POA: Diagnosis not present

## 2015-06-22 DIAGNOSIS — K8020 Calculus of gallbladder without cholecystitis without obstruction: Secondary | ICD-10-CM | POA: Diagnosis not present

## 2015-06-22 DIAGNOSIS — I10 Essential (primary) hypertension: Secondary | ICD-10-CM | POA: Diagnosis not present

## 2015-06-22 DIAGNOSIS — K219 Gastro-esophageal reflux disease without esophagitis: Secondary | ICD-10-CM | POA: Diagnosis not present

## 2015-06-22 DIAGNOSIS — N202 Calculus of kidney with calculus of ureter: Secondary | ICD-10-CM | POA: Diagnosis not present

## 2015-06-30 DIAGNOSIS — S81809A Unspecified open wound, unspecified lower leg, initial encounter: Secondary | ICD-10-CM | POA: Diagnosis not present

## 2015-06-30 DIAGNOSIS — Z23 Encounter for immunization: Secondary | ICD-10-CM | POA: Diagnosis not present

## 2015-07-13 DIAGNOSIS — Z4802 Encounter for removal of sutures: Secondary | ICD-10-CM | POA: Diagnosis not present

## 2015-07-13 DIAGNOSIS — S81819A Laceration without foreign body, unspecified lower leg, initial encounter: Secondary | ICD-10-CM | POA: Diagnosis not present

## 2015-07-14 DIAGNOSIS — Z87440 Personal history of urinary (tract) infections: Secondary | ICD-10-CM | POA: Diagnosis not present

## 2015-07-14 DIAGNOSIS — N201 Calculus of ureter: Secondary | ICD-10-CM | POA: Diagnosis not present

## 2015-07-14 DIAGNOSIS — K8020 Calculus of gallbladder without cholecystitis without obstruction: Secondary | ICD-10-CM | POA: Diagnosis not present

## 2015-07-14 DIAGNOSIS — N200 Calculus of kidney: Secondary | ICD-10-CM | POA: Diagnosis not present

## 2015-07-25 DIAGNOSIS — H524 Presbyopia: Secondary | ICD-10-CM | POA: Diagnosis not present

## 2015-07-25 DIAGNOSIS — H5210 Myopia, unspecified eye: Secondary | ICD-10-CM | POA: Diagnosis not present

## 2015-08-29 DIAGNOSIS — Z23 Encounter for immunization: Secondary | ICD-10-CM | POA: Diagnosis not present

## 2015-08-31 DIAGNOSIS — N201 Calculus of ureter: Secondary | ICD-10-CM | POA: Diagnosis not present

## 2015-08-31 DIAGNOSIS — N390 Urinary tract infection, site not specified: Secondary | ICD-10-CM | POA: Diagnosis not present

## 2015-08-31 DIAGNOSIS — B9620 Unspecified Escherichia coli [E. coli] as the cause of diseases classified elsewhere: Secondary | ICD-10-CM | POA: Diagnosis not present

## 2015-08-31 DIAGNOSIS — N200 Calculus of kidney: Secondary | ICD-10-CM | POA: Diagnosis not present

## 2015-10-14 DIAGNOSIS — K8020 Calculus of gallbladder without cholecystitis without obstruction: Secondary | ICD-10-CM | POA: Diagnosis not present

## 2015-12-05 ENCOUNTER — Other Ambulatory Visit: Payer: Self-pay

## 2015-12-05 DIAGNOSIS — Z1231 Encounter for screening mammogram for malignant neoplasm of breast: Secondary | ICD-10-CM

## 2016-01-02 ENCOUNTER — Ambulatory Visit
Admission: RE | Admit: 2016-01-02 | Discharge: 2016-01-02 | Disposition: A | Discharge status: Home / Self Care | Payer: Commercial Managed Care - HMO | Source: Ambulatory Visit

## 2016-01-02 DIAGNOSIS — Z1231 Encounter for screening mammogram for malignant neoplasm of breast: Secondary | ICD-10-CM | POA: Diagnosis not present

## 2016-01-23 DIAGNOSIS — Z79899 Other long term (current) drug therapy: Secondary | ICD-10-CM | POA: Diagnosis not present

## 2016-01-23 DIAGNOSIS — Z87442 Personal history of urinary calculi: Secondary | ICD-10-CM | POA: Diagnosis not present

## 2016-01-23 DIAGNOSIS — M810 Age-related osteoporosis without current pathological fracture: Secondary | ICD-10-CM | POA: Diagnosis not present

## 2016-01-23 DIAGNOSIS — I10 Essential (primary) hypertension: Secondary | ICD-10-CM | POA: Diagnosis not present

## 2016-02-16 DIAGNOSIS — Z79899 Other long term (current) drug therapy: Secondary | ICD-10-CM | POA: Diagnosis not present

## 2016-02-16 DIAGNOSIS — I10 Essential (primary) hypertension: Secondary | ICD-10-CM | POA: Diagnosis not present

## 2016-02-16 DIAGNOSIS — M810 Age-related osteoporosis without current pathological fracture: Secondary | ICD-10-CM | POA: Diagnosis not present

## 2016-02-16 DIAGNOSIS — Z87442 Personal history of urinary calculi: Secondary | ICD-10-CM | POA: Diagnosis not present

## 2016-02-22 DIAGNOSIS — I10 Essential (primary) hypertension: Secondary | ICD-10-CM | POA: Diagnosis not present

## 2016-02-22 DIAGNOSIS — I341 Nonrheumatic mitral (valve) prolapse: Secondary | ICD-10-CM | POA: Diagnosis not present

## 2016-02-22 DIAGNOSIS — Z0000 Encounter for general adult medical examination without abnormal findings: Secondary | ICD-10-CM | POA: Diagnosis not present

## 2016-02-22 DIAGNOSIS — K219 Gastro-esophageal reflux disease without esophagitis: Secondary | ICD-10-CM | POA: Diagnosis not present

## 2016-02-22 DIAGNOSIS — G4700 Insomnia, unspecified: Secondary | ICD-10-CM | POA: Diagnosis not present

## 2016-02-22 DIAGNOSIS — Z1389 Encounter for screening for other disorder: Secondary | ICD-10-CM | POA: Diagnosis not present

## 2016-02-22 DIAGNOSIS — L989 Disorder of the skin and subcutaneous tissue, unspecified: Secondary | ICD-10-CM | POA: Diagnosis not present

## 2016-02-22 DIAGNOSIS — Z79899 Other long term (current) drug therapy: Secondary | ICD-10-CM | POA: Diagnosis not present

## 2016-02-22 DIAGNOSIS — M810 Age-related osteoporosis without current pathological fracture: Secondary | ICD-10-CM | POA: Diagnosis not present

## 2016-02-23 DIAGNOSIS — M859 Disorder of bone density and structure, unspecified: Secondary | ICD-10-CM | POA: Diagnosis not present

## 2016-02-23 DIAGNOSIS — M8589 Other specified disorders of bone density and structure, multiple sites: Secondary | ICD-10-CM | POA: Diagnosis not present

## 2016-03-07 DIAGNOSIS — Z87440 Personal history of urinary (tract) infections: Secondary | ICD-10-CM | POA: Diagnosis not present

## 2016-03-07 DIAGNOSIS — Z0000 Encounter for general adult medical examination without abnormal findings: Secondary | ICD-10-CM | POA: Diagnosis not present

## 2016-03-07 DIAGNOSIS — N200 Calculus of kidney: Secondary | ICD-10-CM | POA: Diagnosis not present

## 2016-03-07 DIAGNOSIS — N393 Stress incontinence (female) (male): Secondary | ICD-10-CM | POA: Diagnosis not present

## 2016-04-05 DIAGNOSIS — L821 Other seborrheic keratosis: Secondary | ICD-10-CM | POA: Diagnosis not present

## 2016-04-05 DIAGNOSIS — L812 Freckles: Secondary | ICD-10-CM | POA: Diagnosis not present

## 2016-04-05 DIAGNOSIS — B078 Other viral warts: Secondary | ICD-10-CM | POA: Diagnosis not present

## 2016-04-05 DIAGNOSIS — D692 Other nonthrombocytopenic purpura: Secondary | ICD-10-CM | POA: Diagnosis not present

## 2016-04-05 DIAGNOSIS — Z85828 Personal history of other malignant neoplasm of skin: Secondary | ICD-10-CM | POA: Diagnosis not present

## 2016-04-05 DIAGNOSIS — L570 Actinic keratosis: Secondary | ICD-10-CM | POA: Diagnosis not present

## 2016-05-29 DIAGNOSIS — R799 Abnormal finding of blood chemistry, unspecified: Secondary | ICD-10-CM | POA: Diagnosis not present

## 2016-05-29 DIAGNOSIS — E878 Other disorders of electrolyte and fluid balance, not elsewhere classified: Secondary | ICD-10-CM | POA: Diagnosis not present

## 2016-06-06 DIAGNOSIS — N200 Calculus of kidney: Secondary | ICD-10-CM | POA: Diagnosis not present

## 2016-06-06 DIAGNOSIS — N3000 Acute cystitis without hematuria: Secondary | ICD-10-CM | POA: Diagnosis not present

## 2016-12-24 DIAGNOSIS — J329 Chronic sinusitis, unspecified: Secondary | ICD-10-CM | POA: Diagnosis not present

## 2016-12-28 DIAGNOSIS — K219 Gastro-esophageal reflux disease without esophagitis: Secondary | ICD-10-CM | POA: Diagnosis not present

## 2017-02-25 DIAGNOSIS — E7800 Pure hypercholesterolemia, unspecified: Secondary | ICD-10-CM | POA: Diagnosis not present

## 2017-02-25 DIAGNOSIS — D72828 Other elevated white blood cell count: Secondary | ICD-10-CM | POA: Diagnosis not present

## 2017-02-25 DIAGNOSIS — I10 Essential (primary) hypertension: Secondary | ICD-10-CM | POA: Diagnosis not present

## 2017-02-25 DIAGNOSIS — Z79899 Other long term (current) drug therapy: Secondary | ICD-10-CM | POA: Diagnosis not present

## 2017-02-25 DIAGNOSIS — M810 Age-related osteoporosis without current pathological fracture: Secondary | ICD-10-CM | POA: Diagnosis not present

## 2017-02-28 ENCOUNTER — Other Ambulatory Visit: Payer: Self-pay | Admitting: Family Medicine

## 2017-02-28 DIAGNOSIS — Z0000 Encounter for general adult medical examination without abnormal findings: Secondary | ICD-10-CM | POA: Diagnosis not present

## 2017-02-28 DIAGNOSIS — Z1231 Encounter for screening mammogram for malignant neoplasm of breast: Secondary | ICD-10-CM

## 2017-02-28 DIAGNOSIS — M810 Age-related osteoporosis without current pathological fracture: Secondary | ICD-10-CM | POA: Diagnosis not present

## 2017-02-28 DIAGNOSIS — E7800 Pure hypercholesterolemia, unspecified: Secondary | ICD-10-CM | POA: Diagnosis not present

## 2017-02-28 DIAGNOSIS — G4700 Insomnia, unspecified: Secondary | ICD-10-CM | POA: Diagnosis not present

## 2017-02-28 DIAGNOSIS — Z1389 Encounter for screening for other disorder: Secondary | ICD-10-CM | POA: Diagnosis not present

## 2017-02-28 DIAGNOSIS — K219 Gastro-esophageal reflux disease without esophagitis: Secondary | ICD-10-CM | POA: Diagnosis not present

## 2017-02-28 DIAGNOSIS — I10 Essential (primary) hypertension: Secondary | ICD-10-CM | POA: Diagnosis not present

## 2017-02-28 DIAGNOSIS — Z87442 Personal history of urinary calculi: Secondary | ICD-10-CM | POA: Diagnosis not present

## 2017-03-01 DIAGNOSIS — D045 Carcinoma in situ of skin of trunk: Secondary | ICD-10-CM | POA: Diagnosis not present

## 2017-03-01 DIAGNOSIS — D485 Neoplasm of uncertain behavior of skin: Secondary | ICD-10-CM | POA: Diagnosis not present

## 2017-03-01 DIAGNOSIS — L812 Freckles: Secondary | ICD-10-CM | POA: Diagnosis not present

## 2017-03-01 DIAGNOSIS — L570 Actinic keratosis: Secondary | ICD-10-CM | POA: Diagnosis not present

## 2017-03-01 DIAGNOSIS — L821 Other seborrheic keratosis: Secondary | ICD-10-CM | POA: Diagnosis not present

## 2017-03-01 DIAGNOSIS — L820 Inflamed seborrheic keratosis: Secondary | ICD-10-CM | POA: Diagnosis not present

## 2017-03-01 DIAGNOSIS — Z85828 Personal history of other malignant neoplasm of skin: Secondary | ICD-10-CM | POA: Diagnosis not present

## 2017-03-18 ENCOUNTER — Ambulatory Visit
Admission: RE | Admit: 2017-03-18 | Discharge: 2017-03-18 | Disposition: A | Discharge status: Home / Self Care | Payer: Commercial Managed Care - HMO | Source: Ambulatory Visit | Attending: Family Medicine | Admitting: Family Medicine

## 2017-03-18 DIAGNOSIS — Z1231 Encounter for screening mammogram for malignant neoplasm of breast: Secondary | ICD-10-CM | POA: Diagnosis not present

## 2017-03-28 ENCOUNTER — Encounter (HOSPITAL_COMMUNITY): Payer: Self-pay | Admitting: Emergency Medicine

## 2017-03-28 ENCOUNTER — Emergency Department (HOSPITAL_COMMUNITY)
Admission: EM | Admit: 2017-03-28 | Discharge: 2017-03-28 | Disposition: A | Discharge status: Home / Self Care | Payer: Commercial Managed Care - HMO | Attending: Emergency Medicine | Admitting: Emergency Medicine

## 2017-03-28 DIAGNOSIS — Y999 Unspecified external cause status: Secondary | ICD-10-CM | POA: Insufficient documentation

## 2017-03-28 DIAGNOSIS — Y929 Unspecified place or not applicable: Secondary | ICD-10-CM | POA: Diagnosis not present

## 2017-03-28 DIAGNOSIS — Y939 Activity, unspecified: Secondary | ICD-10-CM | POA: Diagnosis not present

## 2017-03-28 DIAGNOSIS — W268XXA Contact with other sharp object(s), not elsewhere classified, initial encounter: Secondary | ICD-10-CM | POA: Diagnosis not present

## 2017-03-28 DIAGNOSIS — S51812A Laceration without foreign body of left forearm, initial encounter: Secondary | ICD-10-CM | POA: Diagnosis not present

## 2017-03-28 DIAGNOSIS — I10 Essential (primary) hypertension: Secondary | ICD-10-CM | POA: Insufficient documentation

## 2017-03-28 NOTE — ED Notes (Signed)
Pt reports her LFA got caught on a wire rack in her pantry today.  Large skin tear noted on her L posterior FA.  No active bleeding noted.

## 2017-03-28 NOTE — Discharge Instructions (Signed)
Keep the wound clean and dry for 5-7 days. These steristrips will usually fall off by themselves or you may be advised by a doctor or nurse to remove them when the wound is healed. To remove them when advised you need to gently peel each end of the strip toward the wound and then detach them.  Contact a health care provider if: Your adhesive strips become soaked with blood or fall off before the wound has healed. The tape will need to be replaced. You have a fever. Get help right away if: You have chills. You develop a rash after the strips are applied. You have a red streak that goes away from the wound. You have more redness, swelling, or pain around your wound. You have more fluid or blood coming from your wound. Your wound feels warm to the touch. You have pus or a bad smell coming from your wound. Your wound breaks open.

## 2017-03-28 NOTE — ED Provider Notes (Signed)
[facility] DEPT Provider Note   CSN: [id number] Arrival date & time: 03/28/17  1725   By signing my name below, I, Neta Mends, attest that this documentation has been prepared under the direction and in the presence of Heath Lark, [place]. Electronically Signed: Neta Mends, ED Scribe. 03/28/2017. 6:32 PM.   History   Chief Complaint Chief Complaint  Patient presents with  . Laceration    The history is provided by the patient. No language interpreter was used.   HPI Comments:  Annette Hernandez is a 76 y.o. female who presents to the Emergency Department complaining of a wound sustained to the left forearm that occurred at 1530 today. Pt reports that she cut her arm on a metal pantry door. Pt went to a clinic at [facility] after sustaining the wound but they did not have any numbing medication to repair the wound so she came to the ED. Bleeding is controlled with pressure dressing. Pt does not take any anticoagulates. Tetanus is UTD, stating she had one about 4 years ago. Pt denies other associated symptoms.    Past Medical History:  Diagnosis Date  . Hypertension     Patient Active Problem List   Diagnosis Date Noted  . Weight loss, non-intentional 09/21/2011  . Foreign body of hip or leg, superficial, infected 07/18/2011  . 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 Bacteremia due to Gram-positive bacteria 07/18/2011  . Depression, acute 07/18/2011    History reviewed. No pertinent surgical history.  OB History    No data available       Home Medications    Prior to Admission medications   Medication Sig Start Date End Date Taking? Authorizing Provider  calcium carbonate (TUMS - DOSED IN MG ELEMENTAL CALCIUM) 500 MG chewable tablet Chew 1 tablet by mouth daily.    Historical Provider, MD  cephALEXin (KEFLEX) 500 MG capsule Take 1 capsule (500 mg total) by mouth 2 (two) times daily. 04/17/13   Margarita Mail, PA-C  losartan (COZAAR) 100 MG tablet  Take 100 mg by mouth daily.    Historical Provider, MD  Multiple Vitamin (MULTIVITAMIN WITH MINERALS) TABS Take 1 tablet by mouth daily.    Historical Provider, MD  ondansetron (ZOFRAN) 4 MG tablet Take 1 tablet (4 mg total) by mouth every 8 (eight) hours as needed for nausea. 04/17/13   Margarita Mail, PA-C  oxyCODONE-acetaminophen (PERCOCET) 5-325 MG per tablet Take 1 tablet by mouth every 4 (four) hours as needed for pain. 04/17/13   Margarita Mail, PA-C  zolpidem (AMBIEN) 10 MG tablet Take 5 mg by mouth at bedtime as needed for sleep.    Historical Provider, MD    Family History Family History  Problem Relation Age of Onset  . Alzheimer's disease Father     Social History Social History  Substance Use Topics  . Smoking status: Never Smoker  . Smokeless tobacco: Not on file  . Alcohol use No     Allergies   Patient has no known allergies.   Review of Systems Review of Systems  Skin: Positive for wound.  Neurological: Negative for numbness.     Physical Exam Updated Vital Signs BP (!) 157/80   Pulse 85   Temp 98.4 F (36.9 C) (Oral)   Resp 16   SpO2 99%   Physical Exam  Constitutional: She appears well-developed and well-nourished.  Well appearing  HENT:  Head: Normocephalic and atraumatic.  Nose: Nose normal.  Eyes: Conjunctivae and EOM are  normal.  Neck: Normal range of motion.  Cardiovascular: Normal rate and intact distal pulses.   2+ distal pulses BUE.   Pulmonary/Chest: Effort normal. No respiratory distress.  Normal work of breathing. No respiratory distress noted.   Abdominal: Soft.  Musculoskeletal: Normal range of motion.  Full ROM of left wrist and fingers.   Neurological: She is alert.  Sensation intact BLE. Muscle strength 5/5.   Skin: Skin is warm.  7.5cm superficial laceration to the dorsal aspect of the left forearm. No active bleeding. No surrounding erythema.  Psychiatric: She has a normal mood and affect. Her behavior is normal.  Nursing  note and vitals reviewed.    ED Treatments / Results  DIAGNOSTIC STUDIES:  Oxygen Saturation is 99% on RA, normal by my interpretation.    COORDINATION OF CARE:  6:29 PM Will repair wound. Discussed treatment plan with pt at bedside and pt agreed to plan.    Labs (all labs ordered are listed, but only abnormal results are displayed) Labs Reviewed - No data to display  EKG  EKG Interpretation None       Radiology No results found.  Procedures .Marland KitchenLaceration Repair Date/Time: 03/28/2017 7:09 PM Performed by: Bettey Costa Authorized by: Bettey Costa   Consent:    Consent obtained:  Verbal   Consent given by:  Patient   Risks discussed:  Infection, need for additional repair, poor cosmetic result and poor wound healing   Alternatives discussed:  No treatment, observation and delayed treatment Anesthesia (see MAR for exact dosages):    Anesthesia method:  None Laceration details:    Location:  Shoulder/arm   Shoulder/arm location:  L lower arm   Length (cm):  7.5   Depth (mm):  0.5 Repair type:    Repair type:  Simple Pre-procedure details:    Preparation:  Patient was prepped and draped in usual sterile fashion Exploration:    Hemostasis achieved with:  Direct pressure   Wound exploration: wound explored through full range of motion and entire depth of wound probed and visualized     Wound extent: no fascia violation noted, no foreign bodies/material noted and no muscle damage noted     Contaminated: no   Treatment:    Area cleansed with:  Hibiclens and saline   Amount of cleaning:  Standard   Irrigation solution:  Sterile saline   Irrigation method:  Pressure wash   Visualized foreign bodies/material removed: no   Skin repair:    Repair method:  Steri-Strips   Number of Steri-Strips:  5 Approximation:    Approximation:  Close   Vermilion border: well-aligned   Post-procedure details:    Dressing:  Sterile dressing   Patient  tolerance of procedure:  Tolerated well, no immediate complications   (including critical care time)  Medications Ordered in ED Medications - No data to display   Initial Impression / Assessment and Plan / ED Course  I have reviewed the triage vital signs and the nursing notes.  Pertinent labs & imaging results that were available during my care of the patient were reviewed by me and considered in my medical decision making (see chart for details).    Patient given steristrips. Patient had a very superficial laceration and has a very thin and frail skin which led me to decide Steri-Strips over suture. Pressure irrigation performed. Wound explored and base of wound visualized in a bloodless field without evidence of foreign body.  Laceration occurred < 8 hours prior to repair  which was well tolerated. Tdap UTD.  Pt has no comorbidities to effect normal wound healing. Pt discharged without antibiotics.  Discussed steristrip home care with patient and answered questions. Pt to follow-up for wound check and steristrip removal in 5-7 days; they are to return to the ED sooner for signs of infection. Pt is hemodynamically stable with no complaints prior to dc.   Patient also seen and evaluated by Dr. Laverta Baltimore who agreed with assessment and plan.  Final Clinical Impressions(s) / ED Diagnoses   Final diagnoses:  Laceration of left forearm, initial encounter    New Prescriptions New Prescriptions   No medications on file  I personally performed the services described in this documentation, which was scribed in my presence. The recorded information has been reviewed and is accurate.    [place], [place] 03/28/17 [place], MD 03/28/17 [phone]

## 2017-03-28 NOTE — ED Triage Notes (Signed)
Pt c/o laceration to left forearm, cut forearm on metal to pantry door at 1530.   Pt already went to clinic but was sent here because clinic did not have pain medicine.

## 2017-04-04 DIAGNOSIS — S41112D Laceration without foreign body of left upper arm, subsequent encounter: Secondary | ICD-10-CM | POA: Diagnosis not present

## 2017-07-05 DIAGNOSIS — M545 Low back pain: Secondary | ICD-10-CM | POA: Diagnosis not present

## 2017-09-04 DIAGNOSIS — Z23 Encounter for immunization: Secondary | ICD-10-CM | POA: Diagnosis not present

## 2017-11-22 DIAGNOSIS — H524 Presbyopia: Secondary | ICD-10-CM | POA: Diagnosis not present

## 2017-11-22 DIAGNOSIS — H26491 Other secondary cataract, right eye: Secondary | ICD-10-CM | POA: Diagnosis not present

## 2017-11-22 DIAGNOSIS — Z961 Presence of intraocular lens: Secondary | ICD-10-CM | POA: Diagnosis not present

## 2017-11-27 DIAGNOSIS — H26491 Other secondary cataract, right eye: Secondary | ICD-10-CM | POA: Diagnosis not present

## 2017-12-23 DIAGNOSIS — Z85828 Personal history of other malignant neoplasm of skin: Secondary | ICD-10-CM | POA: Diagnosis not present

## 2017-12-23 DIAGNOSIS — L570 Actinic keratosis: Secondary | ICD-10-CM | POA: Diagnosis not present

## 2017-12-26 DIAGNOSIS — J209 Acute bronchitis, unspecified: Secondary | ICD-10-CM | POA: Diagnosis not present

## 2018-02-20 DIAGNOSIS — I10 Essential (primary) hypertension: Secondary | ICD-10-CM | POA: Diagnosis not present

## 2018-02-20 DIAGNOSIS — H6590 Unspecified nonsuppurative otitis media, unspecified ear: Secondary | ICD-10-CM | POA: Diagnosis not present

## 2018-03-03 DIAGNOSIS — E7800 Pure hypercholesterolemia, unspecified: Secondary | ICD-10-CM | POA: Diagnosis not present

## 2018-03-03 DIAGNOSIS — M810 Age-related osteoporosis without current pathological fracture: Secondary | ICD-10-CM | POA: Diagnosis not present

## 2018-03-07 DIAGNOSIS — K219 Gastro-esophageal reflux disease without esophagitis: Secondary | ICD-10-CM | POA: Diagnosis not present

## 2018-03-07 DIAGNOSIS — Z0000 Encounter for general adult medical examination without abnormal findings: Secondary | ICD-10-CM | POA: Diagnosis not present

## 2018-03-07 DIAGNOSIS — L989 Disorder of the skin and subcutaneous tissue, unspecified: Secondary | ICD-10-CM | POA: Diagnosis not present

## 2018-03-07 DIAGNOSIS — M810 Age-related osteoporosis without current pathological fracture: Secondary | ICD-10-CM | POA: Diagnosis not present

## 2018-03-07 DIAGNOSIS — E7800 Pure hypercholesterolemia, unspecified: Secondary | ICD-10-CM | POA: Diagnosis not present

## 2018-03-07 DIAGNOSIS — G4700 Insomnia, unspecified: Secondary | ICD-10-CM | POA: Diagnosis not present

## 2018-03-07 DIAGNOSIS — Z1389 Encounter for screening for other disorder: Secondary | ICD-10-CM | POA: Diagnosis not present

## 2018-03-07 DIAGNOSIS — I10 Essential (primary) hypertension: Secondary | ICD-10-CM | POA: Diagnosis not present

## 2018-03-07 DIAGNOSIS — I341 Nonrheumatic mitral (valve) prolapse: Secondary | ICD-10-CM | POA: Diagnosis not present

## 2018-03-22 DIAGNOSIS — S8012XA Contusion of left lower leg, initial encounter: Secondary | ICD-10-CM | POA: Diagnosis not present

## 2018-03-22 DIAGNOSIS — S8011XA Contusion of right lower leg, initial encounter: Secondary | ICD-10-CM | POA: Diagnosis not present

## 2018-05-15 DIAGNOSIS — L570 Actinic keratosis: Secondary | ICD-10-CM | POA: Diagnosis not present

## 2018-05-15 DIAGNOSIS — Z85828 Personal history of other malignant neoplasm of skin: Secondary | ICD-10-CM | POA: Diagnosis not present

## 2018-05-15 DIAGNOSIS — L821 Other seborrheic keratosis: Secondary | ICD-10-CM | POA: Diagnosis not present

## 2018-05-15 DIAGNOSIS — D692 Other nonthrombocytopenic purpura: Secondary | ICD-10-CM | POA: Diagnosis not present

## 2018-05-21 DIAGNOSIS — M8588 Other specified disorders of bone density and structure, other site: Secondary | ICD-10-CM | POA: Diagnosis not present

## 2018-05-21 DIAGNOSIS — M810 Age-related osteoporosis without current pathological fracture: Secondary | ICD-10-CM | POA: Diagnosis not present

## 2018-07-23 DIAGNOSIS — R34 Anuria and oliguria: Secondary | ICD-10-CM | POA: Diagnosis not present

## 2018-08-18 DIAGNOSIS — S81011A Laceration without foreign body, right knee, initial encounter: Secondary | ICD-10-CM | POA: Diagnosis not present

## 2018-08-18 DIAGNOSIS — W19XXXA Unspecified fall, initial encounter: Secondary | ICD-10-CM | POA: Diagnosis not present

## 2018-08-29 DIAGNOSIS — S61419A Laceration without foreign body of unspecified hand, initial encounter: Secondary | ICD-10-CM | POA: Diagnosis not present

## 2018-08-29 DIAGNOSIS — R2681 Unsteadiness on feet: Secondary | ICD-10-CM | POA: Diagnosis not present

## 2018-08-29 DIAGNOSIS — Z4802 Encounter for removal of sutures: Secondary | ICD-10-CM | POA: Diagnosis not present

## 2018-09-01 DIAGNOSIS — L989 Disorder of the skin and subcutaneous tissue, unspecified: Secondary | ICD-10-CM | POA: Diagnosis not present

## 2018-10-01 DIAGNOSIS — Z85828 Personal history of other malignant neoplasm of skin: Secondary | ICD-10-CM | POA: Diagnosis not present

## 2018-10-01 DIAGNOSIS — L570 Actinic keratosis: Secondary | ICD-10-CM | POA: Diagnosis not present

## 2018-10-01 DIAGNOSIS — D0462 Carcinoma in situ of skin of left upper limb, including shoulder: Secondary | ICD-10-CM | POA: Diagnosis not present

## 2018-10-01 DIAGNOSIS — D485 Neoplasm of uncertain behavior of skin: Secondary | ICD-10-CM | POA: Diagnosis not present

## 2018-10-01 DIAGNOSIS — L720 Epidermal cyst: Secondary | ICD-10-CM | POA: Diagnosis not present

## 2018-10-01 DIAGNOSIS — B372 Candidiasis of skin and nail: Secondary | ICD-10-CM | POA: Diagnosis not present

## 2018-11-27 DIAGNOSIS — H524 Presbyopia: Secondary | ICD-10-CM | POA: Diagnosis not present

## 2018-11-27 DIAGNOSIS — H5213 Myopia, bilateral: Secondary | ICD-10-CM | POA: Diagnosis not present

## 2019-01-06 ENCOUNTER — Other Ambulatory Visit: Payer: Self-pay | Admitting: Family Medicine

## 2019-01-06 DIAGNOSIS — Z1231 Encounter for screening mammogram for malignant neoplasm of breast: Secondary | ICD-10-CM

## 2019-01-10 DIAGNOSIS — J209 Acute bronchitis, unspecified: Secondary | ICD-10-CM | POA: Diagnosis not present

## 2019-02-05 ENCOUNTER — Ambulatory Visit: Payer: Self-pay

## 2019-02-05 ENCOUNTER — Ambulatory Visit
Admission: RE | Admit: 2019-02-05 | Discharge: 2019-02-05 | Disposition: A | Discharge status: Home / Self Care | Payer: Medicare HMO | Source: Ambulatory Visit | Attending: Family Medicine | Admitting: Family Medicine

## 2019-02-05 DIAGNOSIS — Z1231 Encounter for screening mammogram for malignant neoplasm of breast: Secondary | ICD-10-CM | POA: Diagnosis not present

## 2019-06-16 DIAGNOSIS — I10 Essential (primary) hypertension: Secondary | ICD-10-CM | POA: Diagnosis not present

## 2019-06-16 DIAGNOSIS — E7800 Pure hypercholesterolemia, unspecified: Secondary | ICD-10-CM | POA: Diagnosis not present

## 2019-06-16 DIAGNOSIS — K219 Gastro-esophageal reflux disease without esophagitis: Secondary | ICD-10-CM | POA: Diagnosis not present

## 2019-06-16 DIAGNOSIS — G4700 Insomnia, unspecified: Secondary | ICD-10-CM | POA: Diagnosis not present

## 2019-07-14 DIAGNOSIS — H6123 Impacted cerumen, bilateral: Secondary | ICD-10-CM | POA: Diagnosis not present

## 2019-07-14 DIAGNOSIS — H6590 Unspecified nonsuppurative otitis media, unspecified ear: Secondary | ICD-10-CM | POA: Diagnosis not present

## 2019-08-08 DIAGNOSIS — M7020 Olecranon bursitis, unspecified elbow: Secondary | ICD-10-CM | POA: Diagnosis not present

## 2019-08-08 DIAGNOSIS — M7989 Other specified soft tissue disorders: Secondary | ICD-10-CM | POA: Diagnosis not present

## 2019-08-10 DIAGNOSIS — M7020 Olecranon bursitis, unspecified elbow: Secondary | ICD-10-CM | POA: Diagnosis not present

## 2019-10-20 DIAGNOSIS — M810 Age-related osteoporosis without current pathological fracture: Secondary | ICD-10-CM | POA: Diagnosis not present

## 2019-10-20 DIAGNOSIS — M7989 Other specified soft tissue disorders: Secondary | ICD-10-CM | POA: Diagnosis not present

## 2019-10-20 DIAGNOSIS — G4700 Insomnia, unspecified: Secondary | ICD-10-CM | POA: Diagnosis not present

## 2019-10-20 DIAGNOSIS — I10 Essential (primary) hypertension: Secondary | ICD-10-CM | POA: Diagnosis not present

## 2019-10-20 DIAGNOSIS — E7800 Pure hypercholesterolemia, unspecified: Secondary | ICD-10-CM | POA: Diagnosis not present

## 2019-10-20 DIAGNOSIS — M7020 Olecranon bursitis, unspecified elbow: Secondary | ICD-10-CM | POA: Diagnosis not present

## 2019-10-20 DIAGNOSIS — K219 Gastro-esophageal reflux disease without esophagitis: Secondary | ICD-10-CM | POA: Diagnosis not present

## 2019-10-27 DIAGNOSIS — I341 Nonrheumatic mitral (valve) prolapse: Secondary | ICD-10-CM | POA: Diagnosis not present

## 2019-10-27 DIAGNOSIS — E7800 Pure hypercholesterolemia, unspecified: Secondary | ICD-10-CM | POA: Diagnosis not present

## 2019-10-27 DIAGNOSIS — G4700 Insomnia, unspecified: Secondary | ICD-10-CM | POA: Diagnosis not present

## 2019-10-27 DIAGNOSIS — M810 Age-related osteoporosis without current pathological fracture: Secondary | ICD-10-CM | POA: Diagnosis not present

## 2019-10-27 DIAGNOSIS — Z0000 Encounter for general adult medical examination without abnormal findings: Secondary | ICD-10-CM | POA: Diagnosis not present

## 2019-10-27 DIAGNOSIS — F419 Anxiety disorder, unspecified: Secondary | ICD-10-CM | POA: Diagnosis not present

## 2019-10-27 DIAGNOSIS — I10 Essential (primary) hypertension: Secondary | ICD-10-CM | POA: Diagnosis not present

## 2019-10-27 DIAGNOSIS — Z8589 Personal history of malignant neoplasm of other organs and systems: Secondary | ICD-10-CM | POA: Diagnosis not present

## 2019-10-27 DIAGNOSIS — K219 Gastro-esophageal reflux disease without esophagitis: Secondary | ICD-10-CM | POA: Diagnosis not present

## 2019-11-30 DIAGNOSIS — H26492 Other secondary cataract, left eye: Secondary | ICD-10-CM | POA: Diagnosis not present

## 2019-11-30 DIAGNOSIS — H524 Presbyopia: Secondary | ICD-10-CM | POA: Diagnosis not present

## 2019-11-30 DIAGNOSIS — Z961 Presence of intraocular lens: Secondary | ICD-10-CM | POA: Diagnosis not present

## 2019-12-09 DIAGNOSIS — H26492 Other secondary cataract, left eye: Secondary | ICD-10-CM | POA: Diagnosis not present

## 2019-12-29 ENCOUNTER — Ambulatory Visit: Payer: Medicare Other | Attending: Internal Medicine

## 2019-12-29 DIAGNOSIS — Z23 Encounter for immunization: Secondary | ICD-10-CM | POA: Insufficient documentation

## 2019-12-29 NOTE — Progress Notes (Signed)
   Covid-19 Vaccination Clinic  Name:  Annette Hernandez    MRN: [id number] DOB: December 13, 1941  12/29/2019  Annette Hernandez was observed post Covid-19 immunization for 15 minutes without incidence. She was provided with Vaccine Information Sheet and instruction to access the V-Safe system.   Annette Hernandez was instructed to call 911 with any severe reactions post vaccine: Marland Kitchen Difficulty breathing  . Swelling of your face and throat  . A fast heartbeat  . A bad rash all over your body  . Dizziness and weakness    Immunizations Administered    Name Date Dose VIS Date Route   Pfizer COVID-19 Vaccine 12/29/2019 12:09 PM 0.3 mL 11/27/2019 Intramuscular   Manufacturer: [place]   Lot: [id number]   [place]: [id number]

## 2020-01-18 ENCOUNTER — Ambulatory Visit: Payer: Medicare HMO | Attending: Internal Medicine

## 2020-01-18 DIAGNOSIS — Z23 Encounter for immunization: Secondary | ICD-10-CM | POA: Insufficient documentation

## 2020-01-18 NOTE — Progress Notes (Signed)
   Covid-19 Vaccination Clinic  Name:  Annette Hernandez    MRN: [id number] DOB: 1941-10-17  01/18/2020  Annette Hernandez was observed post Covid-19 immunization for 15 minutes without incidence. She was provided with Vaccine Information Sheet and instruction to access the V-Safe system.   Annette Hernandez was instructed to call 911 with any severe reactions post vaccine: Marland Kitchen Difficulty breathing  . Swelling of your face and throat  . A fast heartbeat  . A bad rash all over your body  . Dizziness and weakness    Immunizations Administered    Name Date Dose VIS Date Route   Pfizer COVID-19 Vaccine 01/18/2020  9:38 AM 0.3 mL 11/27/2019 Intramuscular   Manufacturer: [place]   Lot: [id number]   [place]: [id number]

## 2020-04-14 DIAGNOSIS — D485 Neoplasm of uncertain behavior of skin: Secondary | ICD-10-CM | POA: Diagnosis not present

## 2020-04-14 DIAGNOSIS — Z85828 Personal history of other malignant neoplasm of skin: Secondary | ICD-10-CM | POA: Diagnosis not present

## 2020-04-14 DIAGNOSIS — C44319 Basal cell carcinoma of skin of other parts of face: Secondary | ICD-10-CM | POA: Diagnosis not present

## 2020-04-14 DIAGNOSIS — L570 Actinic keratosis: Secondary | ICD-10-CM | POA: Diagnosis not present

## 2020-04-22 DIAGNOSIS — Z85828 Personal history of other malignant neoplasm of skin: Secondary | ICD-10-CM | POA: Diagnosis not present

## 2020-04-22 DIAGNOSIS — C44319 Basal cell carcinoma of skin of other parts of face: Secondary | ICD-10-CM | POA: Diagnosis not present

## 2020-10-21 DIAGNOSIS — K219 Gastro-esophageal reflux disease without esophagitis: Secondary | ICD-10-CM | POA: Diagnosis not present

## 2020-10-21 DIAGNOSIS — M810 Age-related osteoporosis without current pathological fracture: Secondary | ICD-10-CM | POA: Diagnosis not present

## 2020-10-21 DIAGNOSIS — E7800 Pure hypercholesterolemia, unspecified: Secondary | ICD-10-CM | POA: Diagnosis not present

## 2020-10-27 DIAGNOSIS — M810 Age-related osteoporosis without current pathological fracture: Secondary | ICD-10-CM | POA: Diagnosis not present

## 2020-10-27 DIAGNOSIS — R300 Dysuria: Secondary | ICD-10-CM | POA: Diagnosis not present

## 2020-10-27 DIAGNOSIS — Z0000 Encounter for general adult medical examination without abnormal findings: Secondary | ICD-10-CM | POA: Diagnosis not present

## 2020-10-27 DIAGNOSIS — I10 Essential (primary) hypertension: Secondary | ICD-10-CM | POA: Diagnosis not present

## 2020-10-27 DIAGNOSIS — K219 Gastro-esophageal reflux disease without esophagitis: Secondary | ICD-10-CM | POA: Diagnosis not present

## 2020-10-27 DIAGNOSIS — I341 Nonrheumatic mitral (valve) prolapse: Secondary | ICD-10-CM | POA: Diagnosis not present

## 2020-10-27 DIAGNOSIS — G4700 Insomnia, unspecified: Secondary | ICD-10-CM | POA: Diagnosis not present

## 2020-10-27 DIAGNOSIS — E7800 Pure hypercholesterolemia, unspecified: Secondary | ICD-10-CM | POA: Diagnosis not present

## 2020-10-27 DIAGNOSIS — F419 Anxiety disorder, unspecified: Secondary | ICD-10-CM | POA: Diagnosis not present

## 2020-10-28 ENCOUNTER — Other Ambulatory Visit (HOSPITAL_COMMUNITY): Payer: Self-pay | Admitting: Family Medicine

## 2020-10-28 DIAGNOSIS — R0989 Other specified symptoms and signs involving the circulatory and respiratory systems: Secondary | ICD-10-CM

## 2020-11-02 ENCOUNTER — Ambulatory Visit (HOSPITAL_COMMUNITY)
Admission: RE | Admit: 2020-11-02 | Discharge: 2020-11-02 | Disposition: A | Discharge status: Home / Self Care | Payer: Medicare HMO | Source: Ambulatory Visit | Attending: Family Medicine | Admitting: Family Medicine

## 2020-11-02 ENCOUNTER — Other Ambulatory Visit: Payer: Self-pay

## 2020-11-02 DIAGNOSIS — R0989 Other specified symptoms and signs involving the circulatory and respiratory systems: Secondary | ICD-10-CM

## 2020-11-07 DIAGNOSIS — M25552 Pain in left hip: Secondary | ICD-10-CM | POA: Diagnosis not present

## 2020-11-14 DIAGNOSIS — M25552 Pain in left hip: Secondary | ICD-10-CM | POA: Diagnosis not present

## 2020-11-18 DIAGNOSIS — M25552 Pain in left hip: Secondary | ICD-10-CM | POA: Diagnosis not present

## 2020-11-22 ENCOUNTER — Other Ambulatory Visit: Payer: Self-pay | Admitting: Family Medicine

## 2020-11-22 DIAGNOSIS — Z1231 Encounter for screening mammogram for malignant neoplasm of breast: Secondary | ICD-10-CM

## 2020-11-22 DIAGNOSIS — M25552 Pain in left hip: Secondary | ICD-10-CM | POA: Diagnosis not present

## 2020-11-22 DIAGNOSIS — M810 Age-related osteoporosis without current pathological fracture: Secondary | ICD-10-CM

## 2020-11-25 DIAGNOSIS — M25552 Pain in left hip: Secondary | ICD-10-CM | POA: Diagnosis not present

## 2020-11-29 DIAGNOSIS — M25552 Pain in left hip: Secondary | ICD-10-CM | POA: Diagnosis not present

## 2020-12-02 DIAGNOSIS — M25552 Pain in left hip: Secondary | ICD-10-CM | POA: Diagnosis not present

## 2020-12-06 DIAGNOSIS — M25552 Pain in left hip: Secondary | ICD-10-CM | POA: Diagnosis not present

## 2020-12-08 DIAGNOSIS — M25552 Pain in left hip: Secondary | ICD-10-CM | POA: Diagnosis not present

## 2020-12-13 DIAGNOSIS — M25552 Pain in left hip: Secondary | ICD-10-CM | POA: Diagnosis not present

## 2020-12-15 DIAGNOSIS — M25552 Pain in left hip: Secondary | ICD-10-CM | POA: Diagnosis not present

## 2020-12-16 ENCOUNTER — Emergency Department (HOSPITAL_COMMUNITY): Payer: Medicare HMO

## 2020-12-16 ENCOUNTER — Encounter (HOSPITAL_COMMUNITY): Payer: Self-pay | Admitting: Emergency Medicine

## 2020-12-16 ENCOUNTER — Other Ambulatory Visit: Payer: Self-pay

## 2020-12-16 ENCOUNTER — Inpatient Hospital Stay (HOSPITAL_COMMUNITY)
Admission: EM | Admit: 2020-12-16 | Discharge: 2020-12-19 | DRG: 872 | Disposition: A | Discharge status: Home / Self Care | Payer: Medicare HMO | Attending: Family Medicine | Admitting: Family Medicine

## 2020-12-16 DIAGNOSIS — R410 Disorientation, unspecified: Secondary | ICD-10-CM

## 2020-12-16 DIAGNOSIS — E871 Hypo-osmolality and hyponatremia: Secondary | ICD-10-CM | POA: Diagnosis not present

## 2020-12-16 DIAGNOSIS — A4152 Sepsis due to Pseudomonas: Secondary | ICD-10-CM | POA: Diagnosis not present

## 2020-12-16 DIAGNOSIS — R29810 Facial weakness: Secondary | ICD-10-CM | POA: Diagnosis not present

## 2020-12-16 DIAGNOSIS — R000 Tachycardia, unspecified: Secondary | ICD-10-CM | POA: Diagnosis not present

## 2020-12-16 DIAGNOSIS — N390 Urinary tract infection, site not specified: Secondary | ICD-10-CM

## 2020-12-16 DIAGNOSIS — I10 Essential (primary) hypertension: Secondary | ICD-10-CM | POA: Diagnosis not present

## 2020-12-16 DIAGNOSIS — B9620 Unspecified Escherichia coli [E. coli] as the cause of diseases classified elsewhere: Secondary | ICD-10-CM

## 2020-12-16 DIAGNOSIS — R531 Weakness: Secondary | ICD-10-CM

## 2020-12-16 DIAGNOSIS — Z20822 Contact with and (suspected) exposure to covid-19: Secondary | ICD-10-CM | POA: Diagnosis present

## 2020-12-16 DIAGNOSIS — M549 Dorsalgia, unspecified: Secondary | ICD-10-CM | POA: Diagnosis not present

## 2020-12-16 DIAGNOSIS — R4182 Altered mental status, unspecified: Secondary | ICD-10-CM | POA: Diagnosis not present

## 2020-12-16 DIAGNOSIS — D72829 Elevated white blood cell count, unspecified: Secondary | ICD-10-CM

## 2020-12-16 DIAGNOSIS — Z820 Family history of epilepsy and other diseases of the nervous system: Secondary | ICD-10-CM | POA: Diagnosis not present

## 2020-12-16 DIAGNOSIS — R52 Pain, unspecified: Secondary | ICD-10-CM | POA: Diagnosis not present

## 2020-12-16 DIAGNOSIS — R4702 Dysphasia: Secondary | ICD-10-CM | POA: Diagnosis not present

## 2020-12-16 DIAGNOSIS — I341 Nonrheumatic mitral (valve) prolapse: Secondary | ICD-10-CM | POA: Diagnosis present

## 2020-12-16 DIAGNOSIS — A419 Sepsis, unspecified organism: Secondary | ICD-10-CM

## 2020-12-16 LAB — CBC WITH DIFFERENTIAL/PLATELET
Abs Immature Granulocytes: 0.17 10*3/uL — ABNORMAL HIGH (ref 0.00–0.07)
Basophils Absolute: 0.1 10*3/uL (ref 0.0–0.1)
Basophils Relative: 0 %
Eosinophils Absolute: 0 10*3/uL (ref 0.0–0.5)
Eosinophils Relative: 0 %
HCT: 40.6 % (ref 36.0–46.0)
Hemoglobin: 13.7 g/dL (ref 12.0–15.0)
Immature Granulocytes: 1 %
Lymphocytes Relative: 4 %
Lymphs Abs: 0.9 10*3/uL (ref 0.7–4.0)
MCH: 31.6 pg (ref 26.0–34.0)
MCHC: 33.7 g/dL (ref 30.0–36.0)
MCV: 93.5 fL (ref 80.0–100.0)
Monocytes Absolute: 0.8 10*3/uL (ref 0.1–1.0)
Monocytes Relative: 3 %
Neutro Abs: 21.7 10*3/uL — ABNORMAL HIGH (ref 1.7–7.7)
Neutrophils Relative %: 92 %
Platelets: 236 10*3/uL (ref 150–400)
RBC: 4.34 MIL/uL (ref 3.87–5.11)
RDW: 12.9 % (ref 11.5–15.5)
WBC: 23.5 10*3/uL — ABNORMAL HIGH (ref 4.0–10.5)
nRBC: 0 % (ref 0.0–0.2)

## 2020-12-16 LAB — COMPREHENSIVE METABOLIC PANEL
ALT: 46 U/L — ABNORMAL HIGH (ref 0–44)
AST: 44 U/L — ABNORMAL HIGH (ref 15–41)
Albumin: 3.7 g/dL (ref 3.5–5.0)
Alkaline Phosphatase: 93 U/L (ref 38–126)
Anion gap: 11 (ref 5–15)
BUN: 26 mg/dL — ABNORMAL HIGH (ref 8–23)
CO2: 20 mmol/L — ABNORMAL LOW (ref 22–32)
Calcium: 8.9 mg/dL (ref 8.9–10.3)
Chloride: 101 mmol/L (ref 98–111)
Creatinine, Ser: 1.03 mg/dL — ABNORMAL HIGH (ref 0.44–1.00)
GFR, Estimated: 55 mL/min — ABNORMAL LOW (ref >60)
Glucose, Bld: 169 mg/dL — ABNORMAL HIGH (ref 70–99)
Potassium: 3.6 mmol/L (ref 3.5–5.1)
Sodium: 132 mmol/L — ABNORMAL LOW (ref 135–145)
Total Bilirubin: 0.4 mg/dL (ref 0.3–1.2)
Total Protein: 6.8 g/dL (ref 6.5–8.1)

## 2020-12-16 LAB — ETHANOL: Alcohol, Ethyl (B): <10 mg/dL (ref <10)

## 2020-12-16 MED ORDER — SODIUM CHLORIDE 0.9 % IV BOLUS
500.0000 mL | Freq: Once | INTRAVENOUS | Status: AC
  Administered 2020-12-16: 500 mL via INTRAVENOUS

## 2020-12-16 NOTE — ED Provider Notes (Signed)
[facility]-EMERGENCY DEPT Provider Note   CSN: [id number] Arrival date & time: 12/16/20  2253     History Chief Complaint  Patient presents with  . Weakness    Annette Hernandez is a 78 y.o. female.  Patient is an 79 year old female with past medical history of hypertension, depression, osteomyelitis of the left hip.  She presents today for evaluation of mental status change.  According to EMS, her husband feels as though she has not been acting quite herself.  She has been speaking less and has appeared generally weak since this morning.  The patient denies to me she is experiencing any specific symptoms such as fever, headache, weakness, chest pain, difficulty breathing, or other issues.  She denies any new medications.  She denies any injury or fall.  She tells me she feels "a little off".  The history is provided by the patient.       Past Medical History:  Diagnosis Date  . Hypertension     Patient Active Problem List   Diagnosis Date Noted  . Weight loss, non-intentional 09/21/2011  . Foreign body of hip or leg, superficial, infected 07/18/2011  . Osteomyelitis of left hip (HCC) 07/18/2011  . Bacteremia due to Gram-positive bacteria 07/18/2011  . Depression, acute 07/18/2011    History reviewed. No pertinent surgical history.   OB History   No obstetric history on file.     Family History  Problem Relation Age of Onset  . Alzheimer's disease Father   . Breast cancer Neg Hx     Social History   Tobacco Use  . Smoking status: Never Smoker  Substance Use Topics  . Alcohol use: No  . Drug use: No    Home Medications Prior to Admission medications   Medication Sig Start Date End Date Taking? Authorizing Provider  calcium carbonate (TUMS - DOSED IN MG ELEMENTAL CALCIUM) 500 MG chewable tablet Chew 1 tablet by mouth daily.    [provider]  cephALEXin (KEFLEX) 500 MG capsule Take 1 capsule (500 mg total) by mouth 2  (two) times daily. 04/17/13   Harris, Abigail, PA-C  losartan (COZAAR) 100 MG tablet Take 100 mg by mouth daily.    [provider]  Multiple Vitamin (MULTIVITAMIN WITH MINERALS) TABS Take 1 tablet by mouth daily.    [provider]  ondansetron (ZOFRAN) 4 MG tablet Take 1 tablet (4 mg total) by mouth every 8 (eight) hours as needed for nausea. 04/17/13   Arthor Captain, PA-C  oxyCODONE-acetaminophen (PERCOCET) 5-325 MG per tablet Take 1 tablet by mouth every 4 (four) hours as needed for pain. 04/17/13   Harris, Cammy Copa, PA-C  zolpidem (AMBIEN) 10 MG tablet Take 5 mg by mouth at bedtime as needed for sleep.    [provider]    Allergies    Patient has no known allergies.  Review of Systems   Review of Systems  All other systems reviewed and are negative.   Physical Exam Updated Vital Signs There were no vitals taken for this visit.  Physical Exam Vitals and nursing note reviewed.  Constitutional:      General: She is not in acute distress.    Appearance: She is well-developed and well-nourished. She is not diaphoretic.  HENT:     Head: Normocephalic and atraumatic.     Mouth/Throat:     Mouth: Mucous membranes are moist.     Pharynx: No oropharyngeal exudate.  Eyes:     Extraocular Movements: Extraocular  movements intact.     Pupils: Pupils are equal, round, and reactive to light.  Cardiovascular:     Rate and Rhythm: Normal rate and regular rhythm.     Heart sounds: No murmur heard. No friction rub. No gallop.   Pulmonary:     Effort: Pulmonary effort is normal. No respiratory distress.     Breath sounds: Normal breath sounds. No wheezing.  Abdominal:     General: Bowel sounds are normal. There is no distension.     Palpations: Abdomen is soft.     Tenderness: There is no abdominal tenderness.  Musculoskeletal:        General: Normal range of motion.     Cervical back: Normal range of motion and neck supple.  Skin:    General: Skin is warm and  dry.  Neurological:     General: No focal deficit present.     Mental Status: She is alert and oriented to person, place, and time.     Cranial Nerves: No cranial nerve deficit.     Sensory: No sensory deficit.     Motor: No weakness.     Coordination: Coordination normal.     ED Results / Procedures / Treatments   Labs (all labs ordered are listed, but only abnormal results are displayed) Labs Reviewed  RESP PANEL BY RT-PCR (FLU A&B, COVID) [id number]  ETHANOL  COMPREHENSIVE METABOLIC PANEL  CBC WITH DIFFERENTIAL/PLATELET  URINALYSIS, ROUTINE W REFLEX MICROSCOPIC    EKG EKG Interpretation  Date/Time:  Saturday December 17 2020 00:19:04 EST Ventricular Rate:  104 PR Interval:    QRS Duration: 126 QT Interval:  358 QTC Calculation: 471 R Axis:   66 Text Interpretation: Sinus tachycardia Right bundle branch block Confirmed by Veryl Speak [phone]) on 12/17/2020 12:43:08 AM   Radiology No results found.  Procedures Procedures (including critical care time)  Medications Ordered in ED Medications  sodium chloride 0.9 % bolus 500 mL (has no administration in time range)    ED Course  I have reviewed the triage vital signs and the nursing notes.  Pertinent labs & imaging results that were available during my care of the patient were reviewed by me and considered in my medical decision making (see chart for details).    MDM Rules/Calculators/A&P  Patient brought by EMS for evaluation of altered mental status.  According to the patient and her husband, she has been unsteady on her feet, weak, and actually urinated on herself today.  Patient states that she feels off, but has no specific aches or pains.  On exam, her vitals are stable and she is afebrile.  She does have a mild tachycardia.  Physical examination is basically unremarkable and neurologic exam is nonfocal.  Patient's work-up shows elevated white count of 22,000 and evidence for a urinary tract infection.  I  suspect this to be the cause.  Patient was given IV Rocephin and will be admitted to the hospitalist service under the care of Dr. Tonie Griffith.  Final Clinical Impression(s) / ED Diagnoses Final diagnoses:  None    Rx / DC Orders ED Discharge Orders    None       Veryl Speak, MD 12/17/20 0300

## 2020-12-16 NOTE — ED Triage Notes (Signed)
Pt BIB [facility] from home. Family reports that she has been acting differently. Not speaking and generalized weak. LKW was 11a. A&Ox4.

## 2020-12-17 ENCOUNTER — Encounter (HOSPITAL_COMMUNITY): Payer: Self-pay | Admitting: Family Medicine

## 2020-12-17 DIAGNOSIS — R410 Disorientation, unspecified: Secondary | ICD-10-CM

## 2020-12-17 DIAGNOSIS — B9620 Unspecified Escherichia coli [E. coli] as the cause of diseases classified elsewhere: Secondary | ICD-10-CM

## 2020-12-17 DIAGNOSIS — I10 Essential (primary) hypertension: Secondary | ICD-10-CM | POA: Diagnosis present

## 2020-12-17 DIAGNOSIS — N390 Urinary tract infection, site not specified: Secondary | ICD-10-CM

## 2020-12-17 DIAGNOSIS — E871 Hypo-osmolality and hyponatremia: Secondary | ICD-10-CM

## 2020-12-17 DIAGNOSIS — D72829 Elevated white blood cell count, unspecified: Secondary | ICD-10-CM

## 2020-12-17 DIAGNOSIS — I341 Nonrheumatic mitral (valve) prolapse: Secondary | ICD-10-CM | POA: Diagnosis present

## 2020-12-17 DIAGNOSIS — Z20822 Contact with and (suspected) exposure to covid-19: Secondary | ICD-10-CM | POA: Diagnosis present

## 2020-12-17 DIAGNOSIS — Z820 Family history of epilepsy and other diseases of the nervous system: Secondary | ICD-10-CM | POA: Diagnosis not present

## 2020-12-17 DIAGNOSIS — A419 Sepsis, unspecified organism: Secondary | ICD-10-CM

## 2020-12-17 DIAGNOSIS — A4152 Sepsis due to Pseudomonas: Secondary | ICD-10-CM | POA: Diagnosis present

## 2020-12-17 LAB — CBC
HCT: 37.8 % (ref 36.0–46.0)
Hemoglobin: 12.7 g/dL (ref 12.0–15.0)
MCH: 31.6 pg (ref 26.0–34.0)
MCHC: 33.6 g/dL (ref 30.0–36.0)
MCV: 94 fL (ref 80.0–100.0)
Platelets: 189 10*3/uL (ref 150–400)
RBC: 4.02 MIL/uL (ref 3.87–5.11)
RDW: 12.9 % (ref 11.5–15.5)
WBC: 21.2 10*3/uL — ABNORMAL HIGH (ref 4.0–10.5)
nRBC: 0 % (ref 0.0–0.2)

## 2020-12-17 LAB — LACTIC ACID, PLASMA
Lactic Acid, Venous: 2.2 mmol/L (ref 0.5–1.9)
Lactic Acid, Venous: 3 mmol/L (ref 0.5–1.9)

## 2020-12-17 LAB — BASIC METABOLIC PANEL
Anion gap: 13 (ref 5–15)
BUN: 24 mg/dL — ABNORMAL HIGH (ref 8–23)
CO2: 18 mmol/L — ABNORMAL LOW (ref 22–32)
Calcium: 8.6 mg/dL — ABNORMAL LOW (ref 8.9–10.3)
Chloride: 104 mmol/L (ref 98–111)
Creatinine, Ser: 0.96 mg/dL (ref 0.44–1.00)
GFR, Estimated: 59 mL/min — ABNORMAL LOW (ref >60)
Glucose, Bld: 116 mg/dL — ABNORMAL HIGH (ref 70–99)
Potassium: 3.7 mmol/L (ref 3.5–5.1)
Sodium: 135 mmol/L (ref 135–145)

## 2020-12-17 LAB — URINALYSIS, ROUTINE W REFLEX MICROSCOPIC
Bilirubin Urine: NEGATIVE
Glucose, UA: NEGATIVE mg/dL
Ketones, ur: 5 mg/dL — AB
Nitrite: POSITIVE — AB
Protein, ur: 100 mg/dL — AB
Specific Gravity, Urine: 1.015 (ref 1.005–1.030)
WBC, UA: >50 WBC/hpf — ABNORMAL HIGH (ref 0–5)
pH: 5 (ref 5.0–8.0)

## 2020-12-17 LAB — RESP PANEL BY RT-PCR (FLU A&B, COVID) ARPGX2
Influenza A by PCR: NEGATIVE
Influenza B by PCR: NEGATIVE
SARS Coronavirus 2 by RT PCR: NEGATIVE

## 2020-12-17 LAB — CREATININE, SERUM
Creatinine, Ser: 0.9 mg/dL (ref 0.44–1.00)
GFR, Estimated: >60 mL/min (ref >60)

## 2020-12-17 LAB — PROCALCITONIN: Procalcitonin: 1.17 ng/mL

## 2020-12-17 MED ORDER — LACTATED RINGERS IV SOLN: INTRAVENOUS | Status: DC

## 2020-12-17 MED ORDER — ACETAMINOPHEN 650 MG RE SUPP: 650.0000 mg | Freq: Four times a day (QID) | RECTAL | Status: DC | PRN

## 2020-12-17 MED ORDER — SODIUM CHLORIDE 0.9 % IV SOLN
1.0000 g | Freq: Once | INTRAVENOUS | Status: AC
  Administered 2020-12-17: 1 g via INTRAVENOUS
  Filled 2020-12-17: qty 10

## 2020-12-17 MED ORDER — SODIUM CHLORIDE 0.9 % IV SOLN
1.0000 g | INTRAVENOUS | Status: DC
  Administered 2020-12-17: 1 g via INTRAVENOUS
  Filled 2020-12-17: qty 10

## 2020-12-17 MED ORDER — ENSURE ENLIVE PO LIQD
237.0000 mL | Freq: Two times a day (BID) | ORAL | Status: DC
  Administered 2020-12-17 – 2020-12-19 (×5): 237 mL via ORAL

## 2020-12-17 MED ORDER — ENOXAPARIN SODIUM 40 MG/0.4ML ~~LOC~~ SOLN
40.0000 mg | SUBCUTANEOUS | Status: DC
  Administered 2020-12-17 – 2020-12-18 (×2): 40 mg via SUBCUTANEOUS
  Filled 2020-12-17 (×2): qty 0.4

## 2020-12-17 MED ORDER — LOSARTAN POTASSIUM 50 MG PO TABS
50.0000 mg | ORAL_TABLET | Freq: Every day | ORAL | Status: DC
  Administered 2020-12-17 – 2020-12-19 (×3): 50 mg via ORAL
  Filled 2020-12-17 (×3): qty 1

## 2020-12-17 MED ORDER — LACTATED RINGERS IV BOLUS
1000.0000 mL | Freq: Once | INTRAVENOUS | Status: AC
  Administered 2020-12-17: 1000 mL via INTRAVENOUS

## 2020-12-17 MED ORDER — PANTOPRAZOLE SODIUM 40 MG PO PACK
40.0000 mg | PACK | Freq: Every day | ORAL | Status: DC
  Filled 2020-12-17: qty 20

## 2020-12-17 MED ORDER — SODIUM CHLORIDE 0.9 % IV BOLUS
1000.0000 mL | Freq: Once | INTRAVENOUS | Status: AC
  Administered 2020-12-17: 1000 mL via INTRAVENOUS

## 2020-12-17 MED ORDER — ESOMEPRAZOLE MAGNESIUM 20 MG PO PACK: 20.0000 mg | PACK | Freq: Every day | ORAL | Status: DC

## 2020-12-17 MED ORDER — SENNOSIDES-DOCUSATE SODIUM 8.6-50 MG PO TABS: 1.0000 | ORAL_TABLET | Freq: Every evening | ORAL | Status: DC | PRN

## 2020-12-17 MED ORDER — PANTOPRAZOLE SODIUM 40 MG PO TBEC
40.0000 mg | DELAYED_RELEASE_TABLET | Freq: Every day | ORAL | Status: DC
  Administered 2020-12-17 – 2020-12-19 (×3): 40 mg via ORAL
  Filled 2020-12-17 (×4): qty 1

## 2020-12-17 MED ORDER — ZOLPIDEM TARTRATE 5 MG PO TABS
5.0000 mg | ORAL_TABLET | Freq: Every evening | ORAL | Status: DC | PRN
  Administered 2020-12-18 – 2020-12-19 (×2): 5 mg via ORAL
  Filled 2020-12-17 (×2): qty 1

## 2020-12-17 MED ORDER — ACETAMINOPHEN 325 MG PO TABS
650.0000 mg | ORAL_TABLET | Freq: Four times a day (QID) | ORAL | Status: DC | PRN
  Administered 2020-12-17 – 2020-12-18 (×4): 650 mg via ORAL
  Filled 2020-12-17 (×4): qty 2

## 2020-12-17 NOTE — ED Notes (Signed)
Date and time results received: 12/17/20 0242 (use smartphrase ".now" to insert current time)  Test: Lactic acid  Critical Value: 3.0  Name of Provider Notified: Chotiner, MD   Orders Received? Or Actions Taken?: Orders Received - See Orders for details and Actions Taken: Provider notified

## 2020-12-17 NOTE — Progress Notes (Signed)
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 left side complicated W osteomyelitis 06/29/2011 Admitted 12/17/2020 with mental status change Septic 2/2 possible UTI T-max 101.8 tachycardia white count 23 lactic acid 3 UA large leukocytes positive nitrates  She feels much better wants to go home  BP 114/60 (BP Location: Left Arm)   Pulse 91   Temp 98.2 F (36.8 C) (Oral)   Resp 19   Ht 5\' 4"  (1.626 m)   Wt 49.3 kg   SpO2 96%   BMI 18.66 kg/m   Awake alert coherent pleasant completely oriented CTA B no added sound no rales no rhonchi Neck soft supple Left hip examined no bruising no swelling no fluctuance ROM intact No lower extremity edema Neurologically intact   Plan Long discussion with patient and husband at the bedside-explained need to get cultures follow cultures and treat as she had sepsis on admission She is understanding of the same    , MD [facility] 9:49 AM

## 2020-12-17 NOTE — H&P (Signed)
History and Physical    Annette Hernandez HQI:[id number] DOB: 01/04/1941 DOA: 12/16/2020  PCP: Juluis Rainier, MD   Patient coming from: Home  Chief Complaint: Confusion  HPI: Annette Hernandez is a 80 y.o. female with medical history significant for hypertension, depression, osteomyelitis of the left hip.  She presents by EMS for evaluation of mental status change.  Annette Hernandez is unable to provide any history and Annette Hernandez is at the bedside.  Annette Hernandez states she has not been acting quite like herself.  She has been speaking less and has appeared generally weak since this morning.    She has not had any vomiting, fever, cough, syncope but she did complain earlier in the day to Annette Hernandez that she had some abdominal pain.   She has not had any known sick contacts.  She has not had any change in Annette medications. She denies any injury or fall.    She has a history of osteomyelitis in Annette hip a few years ago but she has no pain in Annette hip now and she has no rash or erythema in any areas.  ED Course: She is found to have elevated white count greater than 23,000 and a urinary tract infection.  She does have some confusion.  She has not had any hypotension and has been hemodynamically stable.  Blood and urine cultures were obtained in the emergency room.  Hospitalist service asked to admit for further management  Review of Systems:   Unable to obtain review of systems secondary to altered mental status Past Medical History:  Diagnosis Date  . Hypertension     History reviewed. No pertinent surgical history.  Social History  reports that she has never smoked. She has never used smokeless tobacco. She reports that she does not drink alcohol and does not use drugs.  No Known Allergies  Family History  Problem Relation Age of Onset  . Alzheimer's disease Father   . Breast cancer Neg Hx      Prior to Admission medications   Medication Sig Start Date End Date Taking?  Authorizing Provider  esomeprazole (NEXIUM) 20 MG packet Take 20 mg by mouth daily before breakfast.   Yes [provider]  losartan (COZAAR) 100 MG tablet Take 50 mg by mouth daily.   Yes [provider]  Multiple Vitamin (MULTIVITAMIN WITH MINERALS) TABS Take 1 tablet by mouth daily.   Yes [provider]  zolpidem (AMBIEN) 10 MG tablet Take 5 mg by mouth at bedtime as needed for sleep.   Yes [provider]  calcium carbonate (TUMS - DOSED IN MG ELEMENTAL CALCIUM) 500 MG chewable tablet Chew 1 tablet by mouth daily. Patient not taking: Reported on 12/16/2020    [provider]  cephALEXin (KEFLEX) 500 MG capsule Take 1 capsule (500 mg total) by mouth 2 (two) times daily. Patient not taking: No sig reported 04/17/13   Arthor Captain, PA-C  ondansetron (ZOFRAN) 4 MG tablet Take 1 tablet (4 mg total) by mouth every 8 (eight) hours as needed for nausea. Patient not taking: No sig reported 04/17/13   Arthor Captain, PA-C  oxyCODONE-acetaminophen (PERCOCET) 5-325 MG per tablet Take 1 tablet by mouth every 4 (four) hours as needed for pain. Patient not taking: No sig reported 04/17/13   Arthor Captain, PA-C    Physical Exam: Vitals:   12/16/20 2330 12/17/20 0100 12/17/20 0130 12/17/20 0200  BP: 140/63 (!) 149/68 (!) 149/63 115/67  Pulse: (!) 108 (!) 103 Marland Kitchen)  106 (!) 107  Resp: 17 (!) 36 (!) 26 (!) 23  SpO2: 98% 96% 98% 95%    Constitutional: NAD, calm, comfortable Vitals:   12/16/20 2330 12/17/20 0100 12/17/20 0130 12/17/20 0200  BP: 140/63 (!) 149/68 (!) 149/63 115/67  Pulse: (!) 108 (!) 103 (!) 106 (!) 107  Resp: 17 (!) 36 (!) 26 (!) 23  SpO2: 98% 96% 98% 95%   General: WDWN, Alert and oriented to self.  Eyes: EOMI, PERRL, conjunctivae normal.  Sclera nonicteric HENT:  [place]/AT, external ears normal.  Nares patent without epistasis.  Mucous membranes are moist.  Neck: Soft, normal range of motion, supple, no masses, no thyromegaly. Trachea  midline Respiratory: clear to auscultation bilaterally, no wheezing, no crackles. Normal respiratory effort. No accessory muscle use.  Cardiovascular:  Sinus tachycardia. no murmurs / rubs / gallops. No extremity edema. 2+ pedal pulses. Abdomen: Soft, no tenderness, nondistended, no rebound or guarding.  No masses palpated. Bowel sounds normoactive Musculoskeletal: FROM. no clubbing / cyanosis. No joint deformity upper and lower extremities. Normal muscle tone.  Skin: Warm, dry, intact no rashes, lesions, ulcers. No induration Neurologic: CN 2-12 grossly intact.  Normal speech.  Sensation intact Psychiatric: Normal mood. Pleasantly confused.   Labs on Admission: I have personally reviewed following labs and imaging studies  CBC: Recent Labs  Lab 12/16/20 2323  WBC 23.5*  NEUTROABS 21.7*  HGB 13.7  HCT 40.6  MCV 93.5  PLT [id number]    Basic Metabolic Panel: Recent Labs  Lab 12/16/20 2323  NA 132*  K 3.6  CL 101  CO2 20*  GLUCOSE 169*  BUN 26*  CREATININE 1.03*  CALCIUM 8.9    GFR: CrCl cannot be calculated (Unknown ideal weight.).  Liver Function Tests: Recent Labs  Lab 12/16/20 2323  AST 44*  ALT 46*  ALKPHOS 93  BILITOT 0.4  PROT 6.8  ALBUMIN 3.7    Urine analysis:    Component Value Date/Time   COLORURINE YELLOW 12/16/2020 2323   APPEARANCEUR CLOUDY (A) 12/16/2020 2323   LABSPEC 1.015 12/16/2020 2323   PHURINE 5.0 12/16/2020 2323   GLUCOSEU NEGATIVE 12/16/2020 2323   HGBUR MODERATE (A) 12/16/2020 2323   BILIRUBINUR NEGATIVE 12/16/2020 2323   KETONESUR 5 (A) 12/16/2020 2323   PROTEINUR 100 (A) 12/16/2020 2323   UROBILINOGEN 0.2 06/29/2011 0901   NITRITE POSITIVE (A) 12/16/2020 2323   LEUKOCYTESUR LARGE (A) 12/16/2020 2323    Radiological Exams on Admission: DG Chest 2 View  Result Date: 12/16/2020 CLINICAL DATA:  Weakness EXAM: CHEST - 2 VIEW COMPARISON:  06/29/2011 FINDINGS: No focal opacity or pleural effusion. Cardiomediastinal silhouette within  normal limits. No pneumothorax. IMPRESSION: No active cardiopulmonary disease. Electronically Signed   By: Donavan Foil M.D.   On: 12/16/2020 23:57   CT Head Wo Contrast  Result Date: 12/16/2020 CLINICAL DATA:  Mental status change EXAM: CT HEAD WITHOUT CONTRAST TECHNIQUE: Contiguous axial images were obtained from the base of the skull through the vertex without intravenous contrast. COMPARISON:  None. FINDINGS: Brain: No evidence of acute territorial infarction, hemorrhage, hydrocephalus,extra-axial collection or mass lesion/mass effect. There is dilatation the ventricles and sulci consistent with age-related atrophy. Low-attenuation changes in the deep white matter consistent with small vessel ischemia. Vascular: No hyperdense vessel or unexpected calcification. Skull: The skull is intact. No fracture or focal lesion identified. Sinuses/Orbits: The visualized paranasal sinuses and mastoid air cells are clear. The orbits and globes intact. Other: None IMPRESSION: No acute intracranial abnormality. Findings consistent with age  related atrophy and chronic small vessel ischemia Electronically Signed   By: Prudencio Pair M.D.   On: 12/16/2020 23:55    EKG: Independently reviewed.  EKG shows sinus tachycardia with no acute ST elevation or depression.  Right bundle branch block present.  Assessment/Plan Principal Problem:   UTI (urinary tract infection) Ms. Precourt is admitted to Med-Surg floor.  She is started on antibiotic with Rocephin IV fluid hydration with LR at 75 ml/hr after bolus completed Blood and urine cultures were obtained in the emergency room and will be monitored.  MRI will be adjusted if indicated by culture results Check electrolytes and renal function labs in morning  Active Problems:   Sepsis  Meets sepsis criteria with tachycardia, leukocytosis of 23,000 and altered mental status in the setting of urinary tract infection Patient was given 500 mL bolus normal saline in the  emergency room.  We will give another bolus of LR 1000 ml and will recheck lactic acid level in a few hours to assess response to fluid resuscitation    Confusion Acute confusion according to the Hernandez.  Secondary to infection.  Will monitor and should improve with treatment and resolution of infection    Essential hypertension Continue losartan.  Monitor blood pressure    Leukocytosis Recheck CBC in morning    Hyponatremia Chronic hyponatremia that is not severe.  Fluid hydration being provided.  Recheck electrolytes in morning with labs    DVT prophylaxis: Padua score elevated. Lovenox for DVT prophylaxis Code Status:   Full code Family Communication:  Diagnosis and plan discussed with patient and Annette Hernandez who is at bedside.  Questions were answered.  Further recommendations to follow as clinically indicated Disposition Plan:   Patient is from:  Home  Anticipated DC to:  Home  Anticipated DC date:  Anticipate at least 2 midnight stay in the hospital to treat acute condition  Anticipated DC barriers: No barriers to discharge identified at this time  Admission status:  Inpatient   Yevonne Aline Surah Pelley MD [facility]  How to contact the [facility] Attending or Consulting provider [place] or covering provider during after hours [place], for this patient?   1. Check the care team in [facility] and look for a) attending/consulting [facility] provider listed and b) the [facility] team listed 2. Log into [email or web address] and use [place]'s universal password to access. If you do not have the password, please contact the hospital operator. 3. Locate the [facility] provider you are looking for under [facility] and page to a number that you can be directly reached. 4. If you still have difficulty reaching the provider, please page the [facility] (Director on Call) for the Hospitalists listed on amion for assistance.  12/17/2020, 2:17 AM

## 2020-12-17 NOTE — ED Notes (Signed)
Patient is resting comfortably. 

## 2020-12-17 NOTE — ED Notes (Signed)
ED TO INPATIENT HANDOFF REPORT  Name/Age/Gender Annette Hernandez 80 y.o. female  Code Status   Home/SNF/Other Home  Chief Complaint UTI (urinary tract infection) [N39.0]  Level of Care/Admitting Diagnosis ED Disposition    ED Disposition Condition [facility] Area: [place] [100102]  Level of Care: Med-Surg [16]  May admit patient to [facility] or [facility] if equivalent level of care is available:: Yes  Covid Evaluation: Asymptomatic Screening Protocol (No Symptoms)  Diagnosis: UTI (urinary tract infection) [id number]  Admitting Physician: Eben Burow [id number]  Attending Physician: Eben Burow [id number]  Estimated length of stay: past midnight tomorrow  Certification:: I certify this patient will need inpatient services for at least 2 midnights       Medical History Past Medical History:  Diagnosis Date  . Hypertension     Allergies No Known Allergies  IV Location/Drains/Wounds Patient Lines/Drains/Airways Status    Active Line/Drains/Airways    Name Placement date Placement time Site Days   Peripheral IV 12/16/20 Right Antecubital 12/16/20  2321  Antecubital  1          Labs/Imaging Results for orders placed or performed during the hospital encounter of 12/16/20 (from the past 48 hour(s))  Ethanol     Status: None   Collection Time: 12/16/20 11:23 PM  Result Value Ref Range   Alcohol, Ethyl (B) <10 <10 mg/dL    Comment: (NOTE) Lowest detectable limit for serum alcohol is 10 mg/dL.  For medical purposes only. Performed at [facility], [place] [address], [place], [place] [address]   Comprehensive metabolic panel     Status: Abnormal   Collection Time: 12/16/20 11:23 PM  Result Value Ref Range   Sodium 132 (L) 135 - 145 mmol/L   Potassium 3.6 3.5 - 5.1 mmol/L   Chloride 101 98 - 111 mmol/L   CO2 20 (L) 22 - 32 mmol/L   Glucose, Bld 169 (H) 70 - 99 mg/dL    Comment: Glucose  reference range applies only to samples taken after fasting for at least 8 hours.   BUN 26 (H) 8 - 23 mg/dL   Creatinine, Ser 1.03 (H) 0.44 - 1.00 mg/dL   Calcium 8.9 8.9 - 10.3 mg/dL   Total Protein 6.8 6.5 - 8.1 g/dL   Albumin 3.7 3.5 - 5.0 g/dL   AST 44 (H) 15 - 41 U/L   ALT 46 (H) 0 - 44 U/L   Alkaline Phosphatase 93 38 - 126 U/L   Total Bilirubin 0.4 0.3 - 1.2 mg/dL   GFR, Estimated 55 (L) >60 mL/min    Comment: (NOTE) Calculated using the CKD-EPI Creatinine Equation (2021)    Anion gap 11 5 - 15    Comment: Performed at [facility], [place] [address], [place], [place] [address]  CBC with Differential     Status: Abnormal   Collection Time: 12/16/20 11:23 PM  Result Value Ref Range   WBC 23.5 (H) 4.0 - 10.5 K/uL   RBC 4.34 3.87 - 5.11 MIL/uL   Hemoglobin 13.7 12.0 - 15.0 g/dL   HCT 40.6 36.0 - 46.0 %   MCV 93.5 80.0 - 100.0 fL   MCH 31.6 26.0 - 34.0 pg   MCHC 33.7 30.0 - 36.0 g/dL   RDW 12.9 11.5 - 15.5 %   Platelets 236 150 - 400 K/uL   nRBC 0.0 0.0 - 0.2 %   Neutrophils Relative % 92 %  Neutro Abs 21.7 (H) 1.7 - 7.7 K/uL   Lymphocytes Relative 4 %   Lymphs Abs 0.9 0.7 - 4.0 K/uL   Monocytes Relative 3 %   Monocytes Absolute 0.8 0.1 - 1.0 K/uL   Eosinophils Relative 0 %   Eosinophils Absolute 0.0 0.0 - 0.5 K/uL   Basophils Relative 0 %   Basophils Absolute 0.1 0.0 - 0.1 K/uL   Immature Granulocytes 1 %   Abs Immature Granulocytes 0.17 (H) 0.00 - 0.07 K/uL    Comment: Performed at [facility], [place] [address], [place], [place] [address]  Urinalysis, Routine w reflex microscopic Urine, Clean Catch     Status: Abnormal   Collection Time: 12/16/20 11:23 PM  Result Value Ref Range   Color, Urine YELLOW YELLOW   APPearance CLOUDY (A) CLEAR   Specific Gravity, Urine 1.015 1.005 - 1.030   pH 5.0 5.0 - 8.0   Glucose, UA NEGATIVE NEGATIVE mg/dL   Hgb urine dipstick MODERATE (A) NEGATIVE   Bilirubin Urine NEGATIVE NEGATIVE   Ketones,  ur 5 (A) NEGATIVE mg/dL   Protein, ur 100 (A) NEGATIVE mg/dL   Nitrite POSITIVE (A) NEGATIVE   Leukocytes,Ua LARGE (A) NEGATIVE   RBC / HPF 21-50 0 - 5 RBC/hpf   WBC, UA >50 (H) 0 - 5 WBC/hpf   Bacteria, UA RARE (A) NONE SEEN   WBC Clumps PRESENT    Mucus PRESENT    Non Squamous Epithelial 0-5 (A) NONE SEEN    Comment: Performed at [facility], [place] [address] [place], [place] [address]  Resp Panel by RT-PCR (Flu A&B, Covid) Nasopharyngeal Swab     Status: None   Collection Time: 12/16/20 11:24 PM   Specimen: Nasopharyngeal Swab; Nasopharyngeal(NP) swabs in vial transport medium  Result Value Ref Range   SARS Coronavirus 2 by RT PCR NEGATIVE NEGATIVE    Comment: (NOTE) SARS-CoV-2 target nucleic acids are NOT DETECTED.  The SARS-CoV-2 RNA is generally detectable in upper respiratory specimens during the acute phase of infection. The lowest concentration of SARS-CoV-2 viral copies this assay can detect is 138 copies/mL. A negative result does not preclude SARS-Cov-2 infection and should not be used as the sole basis for treatment or other patient management decisions. A negative result may occur with  improper specimen collection/handling, submission of specimen other than nasopharyngeal swab, presence of viral mutation(s) within the areas targeted by this assay, and inadequate number of viral copies(<138 copies/mL). A negative result must be combined with clinical observations, patient history, and epidemiological information. The expected result is Negative.  Fact Sheet for Patients:  EntrepreneurPulse.com.au  Fact Sheet for Healthcare Providers:  IncredibleEmployment.be  This test is no t yet approved or cleared by the [place] FDA and  has been authorized for detection and/or diagnosis of SARS-CoV-2 by FDA under an Emergency Use Authorization (EUA). This EUA will remain  in effect (meaning this test can be used)  for the duration of the COVID-19 declaration under Section 564(b)(1) of the Act, 21 U.S.C.section 360bbb-3(b)(1), unless the authorization is terminated  or revoked sooner.       Influenza A by PCR NEGATIVE NEGATIVE   Influenza B by PCR NEGATIVE NEGATIVE    Comment: (NOTE) The Xpert Xpress SARS-CoV-2/FLU/RSV plus assay is intended as an aid in the diagnosis of influenza from Nasopharyngeal swab specimens and should not be used as a sole basis for treatment. Nasal washings and aspirates are unacceptable for Xpert Xpress SARS-CoV-2/FLU/RSV testing.  Fact Sheet  for Patients: EntrepreneurPulse.com.au  Fact Sheet for Healthcare Providers: IncredibleEmployment.be  This test is not yet approved or cleared by the [place] FDA and has been authorized for detection and/or diagnosis of SARS-CoV-2 by FDA under an Emergency Use Authorization (EUA). This EUA will remain in effect (meaning this test can be used) for the duration of the COVID-19 declaration under Section 564(b)(1) of the Act, 21 U.S.C. section 360bbb-3(b)(1), unless the authorization is terminated or revoked.  Performed at [facility], [place] [address], [place], [place] [address]   Lactic acid, plasma     Status: Abnormal   Collection Time: 12/16/20 11:26 PM  Result Value Ref Range   Lactic Acid, Venous 2.2 (HH) 0.5 - 1.9 mmol/L    Comment: CRITICAL RESULT CALLED TO, READ BACK BY AND VERIFIED WITH: JESSE Doe Run RN 12/16/20 @ 0007 BY P.HENDERSON Performed at [facility], [place] [address] [place], [place] [address]   Lactic acid, plasma     Status: Abnormal   Collection Time: 12/17/20  2:00 AM  Result Value Ref Range   Lactic Acid, Venous 3.0 (HH) 0.5 - 1.9 mmol/L    Comment: CRITICAL RESULT CALLED TO, READ BACK BY AND VERIFIED WITH: Ysidro Evert [facility] 12/17/20 @0243  BY P.HENDERSON Performed at [facility], [place] [address], [place], [place] [address]    DG Chest 2 View  Result Date: 12/16/2020 CLINICAL DATA:  Weakness EXAM: CHEST - 2 VIEW COMPARISON:  06/29/2011 FINDINGS: No focal opacity or pleural effusion. Cardiomediastinal silhouette within normal limits. No pneumothorax. IMPRESSION: No active cardiopulmonary disease. Electronically Signed   By: Donavan Foil M.D.   On: 12/16/2020 23:57   CT Head Wo Contrast  Result Date: 12/16/2020 CLINICAL DATA:  Mental status change EXAM: CT HEAD WITHOUT CONTRAST TECHNIQUE: Contiguous axial images were obtained from the base of the skull through the vertex without intravenous contrast. COMPARISON:  None. FINDINGS: Brain: No evidence of acute territorial infarction, hemorrhage, hydrocephalus,extra-axial collection or mass lesion/mass effect. There is dilatation the ventricles and sulci consistent with age-related atrophy. Low-attenuation changes in the deep white matter consistent with small vessel ischemia. Vascular: No hyperdense vessel or unexpected calcification. Skull: The skull is intact. No fracture or focal lesion identified. Sinuses/Orbits: The visualized paranasal sinuses and mastoid air cells are clear. The orbits and globes intact. Other: None IMPRESSION: No acute intracranial abnormality. Findings consistent with age related atrophy and chronic small vessel ischemia Electronically Signed   By: Prudencio Pair M.D.   On: 12/16/2020 23:55    Pending Labs Unresulted Labs (From admission, onward)          Start     Ordered   12/16/20 2356  Blood culture (routine x 2)  BLOOD CULTURE X 2,   STAT     Question:  Patient immune status  Answer:  Normal   12/16/20 2355   Signed and Held  Procalcitonin  Tomorrow morning,   R        Signed and Held   Signed and Held  CBC  (enoxaparin (LOVENOX)    CrCl >/= 30 ml/min)  Once,   R       Comments: Baseline for enoxaparin therapy IF NOT ALREADY DRAWN.  Notify MD if PLT < 100 K.    Signed and Held   Signed and Held  Creatinine,  serum  (enoxaparin (LOVENOX)    CrCl >/= 30 ml/min)  Once,   R       Comments: Baseline for enoxaparin  therapy IF NOT ALREADY DRAWN.    Signed and Held   Signed and Held  Creatinine, serum  (enoxaparin (LOVENOX)    CrCl >/= 30 ml/min)  Weekly,   R     Comments: while on enoxaparin therapy    Signed and Held   Signed and Held  Basic metabolic panel  Tomorrow morning,   R        Signed and Held   Signed and Held  CBC  Tomorrow morning,   R        Signed and Held          Vitals/Pain Today's Vitals   12/16/20 2330 12/17/20 0100 12/17/20 0130 12/17/20 0200  BP: 140/63 (!) 149/68 (!) 149/63 115/67  Pulse: (!) 108 (!) 103 (!) 106 (!) 107  Resp: 17 (!) 36 (!) 26 (!) 23  SpO2: 98% 96% 98% 95%  PainSc:        Isolation Precautions Airborne and Contact precautions  Medications Medications  sodium chloride 0.9 % bolus 1,000 mL (has no administration in time range)  sodium chloride 0.9 % bolus 500 mL (0 mLs Intravenous Stopped 12/17/20 0054)  cefTRIAXone (ROCEPHIN) 1 g in sodium chloride 0.9 % 100 mL IVPB (0 g Intravenous Stopped 12/17/20 0234)  lactated ringers bolus 1,000 mL (1,000 mLs Intravenous New Bag/Given (Non-Interop) 12/17/20 0234)    Mobility walks

## 2020-12-17 NOTE — ED Notes (Signed)
Date and time results received: 12/17/20 0005 (use smartphrase ".now" to insert current time)  Test: Lactic Acid Critical Value: 2.2  Name of Provider Notified: Delo, MD  Orders Received? Or Actions Taken?: Orders Received - See Orders for details

## 2020-12-18 DIAGNOSIS — N390 Urinary tract infection, site not specified: Secondary | ICD-10-CM | POA: Diagnosis not present

## 2020-12-18 LAB — CBC WITH DIFFERENTIAL/PLATELET
Abs Immature Granulocytes: 0.12 10*3/uL — ABNORMAL HIGH (ref 0.00–0.07)
Basophils Absolute: 0.1 10*3/uL (ref 0.0–0.1)
Basophils Relative: 0 %
Eosinophils Absolute: 0 10*3/uL (ref 0.0–0.5)
Eosinophils Relative: 0 %
HCT: 36.4 % (ref 36.0–46.0)
Hemoglobin: 11.9 g/dL — ABNORMAL LOW (ref 12.0–15.0)
Immature Granulocytes: 1 %
Lymphocytes Relative: 6 %
Lymphs Abs: 1 10*3/uL (ref 0.7–4.0)
MCH: 31.2 pg (ref 26.0–34.0)
MCHC: 32.7 g/dL (ref 30.0–36.0)
MCV: 95.3 fL (ref 80.0–100.0)
Monocytes Absolute: 1.3 10*3/uL — ABNORMAL HIGH (ref 0.1–1.0)
Monocytes Relative: 8 %
Neutro Abs: 14.2 10*3/uL — ABNORMAL HIGH (ref 1.7–7.7)
Neutrophils Relative %: 85 %
Platelets: 178 10*3/uL (ref 150–400)
RBC: 3.82 MIL/uL — ABNORMAL LOW (ref 3.87–5.11)
RDW: 13.1 % (ref 11.5–15.5)
WBC: 16.6 10*3/uL — ABNORMAL HIGH (ref 4.0–10.5)
nRBC: 0 % (ref 0.0–0.2)

## 2020-12-18 LAB — BLOOD CULTURE ID PANEL (REFLEXED) - BCID2

## 2020-12-18 LAB — COMPREHENSIVE METABOLIC PANEL
ALT: 44 U/L (ref 0–44)
AST: 44 U/L — ABNORMAL HIGH (ref 15–41)
Albumin: 3 g/dL — ABNORMAL LOW (ref 3.5–5.0)
Alkaline Phosphatase: 86 U/L (ref 38–126)
Anion gap: 11 (ref 5–15)
BUN: 21 mg/dL (ref 8–23)
CO2: 19 mmol/L — ABNORMAL LOW (ref 22–32)
Calcium: 8.5 mg/dL — ABNORMAL LOW (ref 8.9–10.3)
Chloride: 104 mmol/L (ref 98–111)
Creatinine, Ser: 0.88 mg/dL (ref 0.44–1.00)
GFR, Estimated: >60 mL/min (ref >60)
Glucose, Bld: 105 mg/dL — ABNORMAL HIGH (ref 70–99)
Potassium: 3.4 mmol/L — ABNORMAL LOW (ref 3.5–5.1)
Sodium: 134 mmol/L — ABNORMAL LOW (ref 135–145)
Total Bilirubin: 0.6 mg/dL (ref 0.3–1.2)
Total Protein: 5.7 g/dL — ABNORMAL LOW (ref 6.5–8.1)

## 2020-12-18 MED ORDER — LEVOFLOXACIN 750 MG PO TABS
750.0000 mg | ORAL_TABLET | ORAL | Status: DC
  Administered 2020-12-18: 750 mg via ORAL
  Filled 2020-12-18: qty 1

## 2020-12-18 MED ORDER — LEVOFLOXACIN 500 MG PO TABS
500.0000 mg | ORAL_TABLET | Freq: Every day | ORAL | Status: DC
Start: 2020-12-18 — End: 2020-12-18

## 2020-12-18 NOTE — Discharge Summary (Signed)
Physician Discharge Summary  Annette Hernandez PJA:[id number] DOB: 16-Feb-1941 DOA: 12/16/2020  PCP: Leighton Ruff, MD  Admit date: 12/16/2020 Discharge date: 12/18/2020  Time spent: 26 minutes  Recommendations for Outpatient Follow-up:  1. Get Chem-7 CBC 1 week 2. 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 Admitted 12/17/2020 with mental status change Septic 2/2 possible UTI T-max 101.8 tachycardia white count 23 lactic acid 3 UA large leukocytes positive nitrates  She was started on empiric coverage with ceftriaxone and her white count dropped from 23-16-unfortunately no urine cultures were obtained on admission but as she was responsive to Keflex it was felt that she would be probably pansensitive-she was discharged home with Keflex twice daily on discharge to complete 6 more days of antibiotics her initial blood cultures proved negative  Her sepsis physiology resolved rapidly and she was thought to have met maximal hospital benefit on discharge  Discharge Exam: Vitals:   12/17/20 2340 12/18/20 0536  BP: 126/62 134/67  Pulse: 88 81  Resp: 20 19  Temp: 99 F (37.2 C) 98.2 F (36.8 C)  SpO2: 92% 94%    General: Awake coherent no distress but a little sleepy and slightly unsteady earlier this morning EOMI NCAT no focal deficit Cardiovascular: S1-S2 no murmur no rub no gallop Respiratory: Clinically clear Abdomen soft nontender no rebound no  guarding ROM intact Neurologically grossly intact  Discharge Instructions   Discharge Instructions    Diet - low sodium heart healthy   Complete by: As directed    Discharge instructions   Complete by: As directed    We will prescribe on discharge levaquin for probably the next 6 days  your cultures from your blood were negative from admission however we did not obtain a proper urine specimen to test your urine-if you have fever or chills reported history of primary physician so that they can adjust your antibiotics as appropriate would recommend labs in about 1 week at primary care physician office   Increase activity slowly   Complete by: As directed      Allergies as of 12/19/2020   No Known Allergies     Medication List    STOP taking these medications   cephALEXin 500 MG capsule Commonly known as: KEFLEX     TAKE these medications   calcium carbonate 500 MG chewable tablet Commonly known as: TUMS - dosed in mg elemental calcium Chew 1 tablet by mouth daily.   esomeprazole 20 MG packet Commonly known as: NEXIUM Take 20 mg by mouth daily before breakfast.   levofloxacin 750 MG tablet Commonly known as: LEVAQUIN Take 1 tablet (750 mg total) by mouth every other day. Start taking on: December 20, 2020   losartan 100 MG tablet Commonly known as: COZAAR Take 50 mg by mouth daily.   multivitamin with minerals Tabs tablet Take 1 tablet by mouth daily.   ondansetron 4 MG tablet Commonly known as: Zofran Take 1 tablet (4 mg total) by mouth every 8 (eight) hours  as needed for nausea.   oxyCODONE-acetaminophen 5-325 MG tablet Commonly known as: Percocet Take 1 tablet by mouth every 4 (four) hours as needed for pain.   zolpidem 10 MG tablet Commonly known as: AMBIEN Take 5 mg by mouth at bedtime as needed for sleep.      No Known Allergies    The results of significant diagnostics from this hospitalization (including imaging, microbiology, ancillary and  laboratory) are listed below for reference.    Significant Diagnostic Studies: DG Chest 2 View  Result Date: 12/16/2020 CLINICAL DATA:  Weakness EXAM: CHEST - 2 VIEW COMPARISON:  06/29/2011 FINDINGS: No focal opacity or pleural effusion. Cardiomediastinal silhouette within normal limits. No pneumothorax. IMPRESSION: No active cardiopulmonary disease. Electronically Signed   By: Donavan Foil M.D.   On: 12/16/2020 23:57   CT Head Wo Contrast  Result Date: 12/16/2020 CLINICAL DATA:  Mental status change EXAM: CT HEAD WITHOUT CONTRAST TECHNIQUE: Contiguous axial images were obtained from the base of the skull through the vertex without intravenous contrast. COMPARISON:  None. FINDINGS: Brain: No evidence of acute territorial infarction, hemorrhage, hydrocephalus,extra-axial collection or mass lesion/mass effect. There is dilatation the ventricles and sulci consistent with age-related atrophy. Low-attenuation changes in the deep white matter consistent with small vessel ischemia. Vascular: No hyperdense vessel or unexpected calcification. Skull: The skull is intact. No fracture or focal lesion identified. Sinuses/Orbits: The visualized paranasal sinuses and mastoid air cells are clear. The orbits and globes intact. Other: None IMPRESSION: No acute intracranial abnormality. Findings consistent with age related atrophy and chronic small vessel ischemia Electronically Signed   By: Prudencio Pair M.D.   On: 12/16/2020 23:55    Microbiology: Recent Results (from the past 240 hour(s))  Blood culture (routine x 2)     Status: None (Preliminary result)   Collection Time: 12/16/20 11:22 PM   Specimen: BLOOD  Result Value Ref Range Status   Specimen Description   Final    BLOOD RIGHT ANTECUBITAL Performed at [place] [address], [place], [place] [address]    Special Requests   Final    BOTTLES DRAWN AEROBIC AND ANAEROBIC Blood Culture adequate volume Performed at [place] [address], [place], [place] [address]    Culture   Final    NO GROWTH 1 DAY Performed at [facility], [place] [address] [place], [place] [address]    Report Status PENDING  Incomplete  Resp Panel by RT-PCR (Flu A&B, Covid) Nasopharyngeal Swab     Status: None   Collection Time: 12/16/20 11:24 PM   Specimen: Nasopharyngeal Swab; Nasopharyngeal(NP) swabs in vial transport medium  Result Value Ref Range Status   SARS Coronavirus 2 by RT PCR NEGATIVE NEGATIVE Final    Comment: (NOTE) SARS-CoV-2 target nucleic acids are NOT DETECTED.  The SARS-CoV-2 RNA is generally detectable in upper respiratory specimens during the acute phase of infection. The lowest concentration of SARS-CoV-2 viral copies this assay can detect is 138 copies/mL. A negative result does not preclude SARS-Cov-2 infection and should not be used as the sole basis for treatment or other patient management decisions. A negative result may occur with  improper specimen collection/handling, submission of specimen other than nasopharyngeal swab, presence of viral mutation(s) within the areas targeted by this assay, and inadequate number of viral copies(<138 copies/mL). A negative result must be combined with clinical observations, patient history, and epidemiological information. The expected result is Negative.  Fact Sheet for Patients:  EntrepreneurPulse.com.au  Fact Sheet for Healthcare Providers:  IncredibleEmployment.be  This test is no t yet approved or cleared by the [place] FDA and  has been authorized for detection and/or diagnosis of SARS-CoV-2 by FDA under an Emergency Use Authorization (EUA). This EUA will remain  in effect (meaning this test can be used) for the duration of the COVID-19 declaration under Section 564(b)(1) of the Act, 21 U.S.C.section 360bbb-3(b)(1), unless the authorization is terminated  or revoked sooner.        Influenza A by PCR NEGATIVE NEGATIVE Final   Influenza B by PCR NEGATIVE NEGATIVE Final    Comment: (NOTE) The Xpert Xpress SARS-CoV-2/FLU/RSV plus assay is intended as an aid in the diagnosis of influenza from Nasopharyngeal swab specimens and should not be used as a sole basis for treatment. Nasal washings and aspirates are unacceptable for Xpert Xpress SARS-CoV-2/FLU/RSV testing.  Fact Sheet for Patients: EntrepreneurPulse.com.au  Fact Sheet for Healthcare Providers: IncredibleEmployment.be  This test is not yet approved or cleared by the [place] FDA and has been authorized for detection and/or diagnosis of SARS-CoV-2 by FDA under an Emergency Use Authorization (EUA). This EUA will remain in effect (meaning this test can be used) for the duration of the COVID-19 declaration under Section 564(b)(1) of the Act, 21 U.S.C. section 360bbb-3(b)(1), unless the authorization is terminated or revoked.  Performed at [facility], [place] [address], [place], [place] [address]   Blood culture (routine x 2)     Status: None (Preliminary result)   Collection Time: 12/17/20  2:09 AM   Specimen: BLOOD  Result Value Ref Range Status   Specimen Description   Final    BLOOD RIGHT HAND Performed at [place] [address] [place], [place] [address]    Special Requests   Final    BOTTLES DRAWN AEROBIC ONLY Blood Culture results may not be optimal due to an inadequate volume of blood received in culture bottles Performed at [place] [address] [place], [place] [address]    Culture  Setup Time   Final    GRAM NEGATIVE RODS AEROBIC BOTTLE ONLY Organism ID to follow Performed at [facility], [place] [address], [place],  [address]    Culture GRAM NEGATIVE RODS  Final   Report Status PENDING  Incomplete     Labs: Basic Metabolic Panel: Recent Labs  Lab 12/16/20 2323  12/17/20 0534 12/18/20 0559  NA 132* 135 134*  K 3.6 3.7 3.4*  CL 101 104 104  CO2 20* 18* 19*  GLUCOSE 169* 116* 105*  BUN 26* 24* 21  CREATININE 1.03* 0.90  0.96 0.88  CALCIUM 8.9 8.6* 8.5*   Liver Function Tests: Recent Labs  Lab 12/16/20 2323 12/18/20 0559  AST 44* 44*  ALT 46* 44  ALKPHOS 93 86  BILITOT 0.4 0.6  PROT 6.8 5.7*  ALBUMIN 3.7 3.0*   No results for input(s): LIPASE, AMYLASE in the last 168 hours. No results for input(s): AMMONIA in the last 168 hours. CBC: Recent Labs  Lab 12/16/20 2323 12/17/20 0534 12/18/20 0559  WBC 23.5* 21.2* 16.6*  NEUTROABS 21.7*  --  14.2*  HGB 13.7 12.7 11.9*  HCT 40.6 37.8 36.4  MCV 93.5 94.0 95.3  PLT 236 189 178   Cardiac Enzymes: No results for input(s): CKTOTAL, CKMB, CKMBINDEX, TROPONINI in the last 168 hours. BNP: BNP (last 3 results) No results for input(s): BNP in the last 8760 hours.  ProBNP (last  3 results) No results for input(s): PROBNP in the last 8760 hours.  CBG: No results for input(s): GLUCAP in the last 168 hours.     Signed:  Nita Sells MD   [facility] 12/18/2020, 10:35 AM

## 2020-12-18 NOTE — Evaluation (Signed)
Physical Therapy Evaluation Patient Details Name: Annette Hernandez MRN: [id number] DOB: 19-Jan-1941 Today's Date: 12/18/2020   History of Present Illness  80 yo female admitted with AMS, sepsis 2* UTI, weakness, Hx of Paget's disease, MVP, depression, osteomyelitis L hip  Clinical Impression  On eval, pt was Min assist for mobility. She walked ~175 feet with a RW. Pt tolerated activity well. Pt presents with general weakness, and impaired gait and balance. Both pt and husband report that pt is not near her mobility baseline. Discussed d/c plan-husband stated he feels he can manage with pt at home. Would recommend HHPT, HHOT, and home health aide, if possible.     Follow Up Recommendations Supervision/Assistance - 24 hour;Home health PT; Home Health OT; Home Health Aide    Equipment Recommendations  None recommended by PT    Recommendations for Other Services       Precautions / Restrictions Precautions Precautions: Fall Restrictions Weight Bearing Restrictions: No      Mobility  Bed Mobility Overal bed mobility: Needs Assistance Bed Mobility: Supine to Sit     Supine to sit: Min assist;HOB elevated     General bed mobility comments: Assist for trunk and LEs. Increased time. Cues for safety, technique.    Transfers Overall transfer level: Needs assistance Equipment used: Rolling walker (2 wheeled) Transfers: Sit to/from Stand Sit to Stand: Min assist         General transfer comment: VCs safety, technique, hand placement. Assist to rise, stabilize, control descent.  Ambulation/Gait Ambulation/Gait assistance: Min assist Gait Distance (Feet): 175 Feet Assistive device: Rolling walker (2 wheeled) Gait Pattern/deviations: Step-through pattern;Decreased stride length     General Gait Details: Intermittent assist to steady. Pt tolerated distance well.  Stairs            Wheelchair Mobility    Modified Rankin (Stroke Patients Only)       Balance  Overall balance assessment: Needs assistance         Standing balance support: Bilateral upper extremity supported Standing balance-Leahy Scale: Fair                               Pertinent Vitals/Pain Pain Assessment: 0-10 Pain Score: 5  Pain Location: back Pain Descriptors / Indicators: Aching Pain Intervention(s): Monitored during session    Home Living Family/patient expects to be discharged to:: Private residence Living Arrangements: Spouse/significant other Available Help at Discharge: Family Type of Home: House Home Access: Stairs to enter Entrance Stairs-Rails: Doctor, general practice of Steps: a few Home Layout: Bed/bath upstairs;Able to live on main level with bedroom/bathroom Home Equipment: Dan Humphreys - 2 wheels;Cane - single point      Prior Function Level of Independence: Independent               Hand Dominance        Extremity/Trunk Assessment   Upper Extremity Assessment Upper Extremity Assessment: Generalized weakness    Lower Extremity Assessment Lower Extremity Assessment: Generalized weakness    Cervical / Trunk Assessment Cervical / Trunk Assessment: Normal  Communication   Communication: No difficulties  Cognition Arousal/Alertness: Awake/alert Behavior During Therapy: WFL for tasks assessed/performed Overall Cognitive Status: Within Functional Limits for tasks assessed                                        General Comments  Exercises General Exercises - Lower Extremity Long Arc Quad: AROM;Both;15 reps;Seated Hip Flexion/Marching: AROM;Both;15 reps;Seated Other Exercises Other Exercises: sit to stand x 3   Assessment/Plan    PT Assessment Patient needs continued PT services  PT Problem List Decreased strength;Decreased mobility;Decreased activity tolerance;Decreased balance;Decreased knowledge of use of DME       PT Treatment Interventions DME instruction;Gait  training;Therapeutic activities;Therapeutic exercise;Patient/family education;Balance training;Functional mobility training    PT Goals (Current goals can be found in the Care Plan section)  Acute Rehab PT Goals Patient Stated Goal: to regain PLOF PT Goal Formulation: With patient/family Time For Goal Achievement: 01/01/21 Potential to Achieve Goals: Good    Frequency Min 3X/week   Barriers to discharge        Co-evaluation               AM-PAC PT "6 Clicks" Mobility  Outcome Measure Help needed turning from your back to your side while in a flat bed without using bedrails?: A Little Help needed moving from lying on your back to sitting on the side of a flat bed without using bedrails?: A Little Help needed moving to and from a bed to a chair (including a wheelchair)?: A Little Help needed standing up from a chair using your arms (e.g., wheelchair or bedside chair)?: A Little Help needed to walk in hospital room?: A Little Help needed climbing 3-5 steps with a railing? : A Little 6 Click Score: 18    End of Session Equipment Utilized During Treatment: Gait belt Activity Tolerance: Patient tolerated treatment well Patient left: in bed;with call bell/phone within reach;with bed alarm set;with family/visitor present   PT Visit Diagnosis: Muscle weakness (generalized) (M62.81)    Time: [id number] PT Time Calculation (min) (ACUTE ONLY): 26 min   Charges:   PT Evaluation $PT Eval Moderate Complexity: 1 Mod PT Treatments $Gait Training: 8-22 mins          Doreatha Massed, PT Acute Rehabilitation  Office: [phone] Pager: [phone]

## 2020-12-18 NOTE — Discharge Instructions (Signed)
Constipation Nutrition Therapy Fiber and fluid may help you feel less constipated and bloated and can help ease constipation. Increase fiber slowly over the course of a few weeks, which will keep your symptoms from getting worse.  Tips for Adding Fiber to Your Eating Plan Slowly increase the amount of fiber that you eat. Over the span of a few days, you should increase fiber by no more than 5 grams (g) until you reach a goal of 25 to 35 grams per day. Eat whole grain breads and cereals. Look for choices with 100% whole wheat, rye, oats, or bran as the first or second ingredient. Have brown or wild rice instead of white rice or potatoes. Enjoy a variety of grains. Grains higher in fiber include barley, oats, farro, kamut, and quinoa. Bake with whole wheat flour. You can use it to replace some white or all-purpose flour in recipes. Enjoy baked beans more often! Add dried beans and peas to casseroles or soups. Choose fresh fruit and vegetables instead of juices. Eat fruits and vegetables with peels or skins on. Compare food labels of similar foods to find higher-fiber choices. Packaged foods have the amount of fiber per serving listed on the Nutrition Facts label. Drink plenty of fluids. Set a goal of at least 8 cups per day. You may need even more with higher amounts of fiber. Fluid helps your body process fiber without discomfort. If you are taking calcium or iron supplements, check with your doctor or registered dietitian. You may be able to take smaller amounts several times a day.  Foods Recommended Foods With at Least 4 grams of Fiber Per Serving  Grains  1/3 cup- cup high-fiber cereal. Check Nutrition Facts labels and choose products with 4 grams dietary fiber or more per serving. Dried Beans and Peas  cup cooked red beans, kidney beans, large lima beans, navy beans, pinto beans, white beans, lentils, or black-eyed peas Vegetables  1 artichoke (cooked) 1 medium sweet pot ato  (baked, with skin) 1 medium potato (baked, with skin)  cup green peas (cooked) Fruits   cup blackberries or raspberries 4 prunes (dried)  Foods With at Least 1 to 3 grams of Fiber per Serving  Grains  1 bagel (3.5-inch diameter) 1 slice whole wheat, cracked wheat, pumpernickel, or rye bread 2-inch square cornbread 4 whole wheat crackers 1 bran, blueberry, cornm eal, or English muffin  cup cereal with 1-3 grams fiber per serving (check dietary fiber on the product's Nutrition Facts label) 2 tablespoons bran, rice, or wheat cereal 2 Tablespoons wheat germ or whole wheat flour Vegetables   cup bean sprouts (raw)  cup beets (diced, canned)  cup broccoli, brussels sprouts, or cabbage (cooked)  cup carrots  cup cauliflower  cup corn  cup eggplant  cup okra (boiled)  cup potatoes (baked or mashed)  cup spinach, kale, or turnip gree ns (cooked)  cup squash--winter, summer, or zucchini (cooked)  cup sweet potatoes or yams  cup tomatoes (canned) Fruits  1 apple (3-inch diameter) or  cup applesauce  cup apricots (canned) 1 banana  cup ch erries (canned or fresh)  cup cranberries (fresh) 3 dates (whole) 2 medium figs ( fresh)  cup fruit cocktail (c anned)  grapefruit 1 kiwi fruit 1 orange (2 -inch diameter) 1 peach (fresh) or  cup pea ches (canned) 1 pear (fresh) or  cup pears (canned) 1 plum (2-inch diameter)  cup raisins  cup strawberries (fresh) 1 tangerine Other  2 tablespoons almonds or peanuts 1 cup  popcorn (popped)  Constipation Sample 1-Day Menu Breakfast 1/2 cup bran cereal 1/8 cup raisins 1 cup fat-free milk 1/2 cup orange juice with pulp 1 cup coffee Lunch 1 cup chili made with 1/2 cup beans per serving 2 tablespoons shredded cheese 1 fresh apple, with skin 2 cups water Afternoon Snack 1 cup yogurt 2 cups water Evening Meal 3 ounces chicken, baked 2 cups mixed vegetables 1/2 cup brown rice 1/4 cup fresh raspberries 1/4  cup fresh blueberries 1/4 cup sliced bananas 1 cup water Evening Snack  2 tablespoons almonds 1 cup hot tea  Copyright Academy of [employer] and Dietetics. This handout may be duplicated for client education.

## 2020-12-18 NOTE — Progress Notes (Signed)
PHARMACY - PHYSICIAN COMMUNICATION CRITICAL VALUE ALERT - BLOOD CULTURE IDENTIFICATION (BCID)  Annette Hernandez is an 80 y.o. female who presented to [facility] on 12/16/2020 with a chief complaint of urosepsis.  No Urine culture available.  Assessment: 1/4 bottles GNR, BCID +Pseudomonas  Name of physician (or Provider) Contacted: Dr Mahala Menghini  Current antibiotics: Ceftriaxone  Changes to prescribed antibiotics recommended:  Cefepime 2g IV q12h. Recommendations declined by provider due to possible patient discharge if clinically improving.  Provider will change from Keflex at discharge to Levaquin.   Results for orders placed or performed during the hospital encounter of 12/16/20  Blood Culture ID Panel (Reflexed) (Collected: 12/17/2020  2:09 AM)  Result Value Ref Range   Enterococcus faecalis NOT DETECTED NOT DETECTED   Enterococcus Faecium NOT DETECTED NOT DETECTED   Listeria monocytogenes NOT DETECTED NOT DETECTED   Staphylococcus species NOT DETECTED NOT DETECTED   Staphylococcus aureus (BCID) NOT DETECTED NOT DETECTED   Staphylococcus epidermidis NOT DETECTED NOT DETECTED   Staphylococcus lugdunensis NOT DETECTED NOT DETECTED   Streptococcus species NOT DETECTED NOT DETECTED   Streptococcus agalactiae NOT DETECTED NOT DETECTED   Streptococcus pneumoniae NOT DETECTED NOT DETECTED   Streptococcus pyogenes NOT DETECTED NOT DETECTED   A.calcoaceticus-baumannii NOT DETECTED NOT DETECTED   Bacteroides fragilis NOT DETECTED NOT DETECTED   Enterobacterales NOT DETECTED NOT DETECTED   Enterobacter cloacae complex NOT DETECTED NOT DETECTED   Escherichia coli NOT DETECTED NOT DETECTED   Klebsiella aerogenes NOT DETECTED NOT DETECTED   Klebsiella oxytoca NOT DETECTED NOT DETECTED   Klebsiella pneumoniae NOT DETECTED NOT DETECTED   Proteus species NOT DETECTED NOT DETECTED   Salmonella species NOT DETECTED NOT DETECTED   Serratia marcescens NOT DETECTED NOT DETECTED   Haemophilus  influenzae NOT DETECTED NOT DETECTED   Neisseria meningitidis NOT DETECTED NOT DETECTED   Pseudomonas aeruginosa DETECTED (A) NOT DETECTED   Stenotrophomonas maltophilia NOT DETECTED NOT DETECTED   Candida albicans NOT DETECTED NOT DETECTED   Candida auris NOT DETECTED NOT DETECTED   Candida glabrata NOT DETECTED NOT DETECTED   Candida krusei NOT DETECTED NOT DETECTED   Candida parapsilosis NOT DETECTED NOT DETECTED   Candida tropicalis NOT DETECTED NOT DETECTED   Cryptococcus neoformans/gattii NOT DETECTED NOT DETECTED   CTX-M ESBL NOT DETECTED NOT DETECTED   Carbapenem resistance IMP NOT DETECTED NOT DETECTED   Carbapenem resistance KPC NOT DETECTED NOT DETECTED   Carbapenem resistance NDM NOT DETECTED NOT DETECTED   Carbapenem resistance VIM NOT DETECTED NOT DETECTED    Lynann Beaver PharmD, BCPS Clinical Pharmacist [facility] main pharmacy [phone] 12/18/2020 11:03 AM

## 2020-12-18 NOTE — Progress Notes (Signed)
Patient ambulated but usually is more steady on her feet-and husband also expresses concerns about going home given her prior level of function   in addition blood cultures have resulted + with Pseudomonas with sensitivities pending-theoretically should be covered by Levaquin but we will hold discharge at this time and reevaluate in the a.m. with broadening of antibiotics if clinically a little worse

## 2020-12-18 NOTE — Progress Notes (Signed)
Initial Nutrition Assessment  DOCUMENTATION CODES:   Not applicable  INTERVENTION:  Continue Ensure Enlive po BID, each supplement provides 350 kcal and 20 grams of protein (pour over ice)  CIB po daily with breakfast, each supplement with 237 ml milk provides 280 kcal and 13 grams protein  Education provided -Constipation handout attached to d/c instructions  Consider scheduled bowel regimen    NUTRITION DIAGNOSIS:   Inadequate oral intake related to decreased appetite as evidenced by per patient/family report  GOAL:   Patient will meet greater than or equal to 90% of their needs    MONITOR:   Labs,I & O's,Supplement acceptance,PO intake,Weight trends  REASON FOR ASSESSMENT:   Malnutrition Screening Tool    ASSESSMENT:  80 year old female admitted for UTI after presenting with altered mental status and generalized weakness. Past medical history significant for HTN, CHF, COPD, depression, CKD, hypothyroidism, dementia, Paget's disease, mitral valve prolapse, and osteomyelitis of left hip.  RD working remotely.  RD able to speak with pt via phone this afternoon, reports doing too good today. She endorses poor appetite during admission, says she has not been hungry at meal times and does not care for facility food. She reports not eating large meals at baseline, eats when she is hungry. Patient reports she enjoys spaghetti, pizza, and ice cream. RD discussed the importance of adequate nutrition and encouraged po intake. She reports Ensure has a chalky taste and is tolerating half of supplement twice daily. RD suggested pouring over ice for better tolerance. She was appreciative of suggestion and agreeable to trying CIB on breakfast tray as alternate supplement.   Patient recalls usual weight around 109 lbs. Currently weighs 49.3 kg (108.46 lbs). There is no recent weight history for review.   Noted type 2 BM today, patient endorses chronic constipation, does not drink  water at home. RD discussed strategies to improve constipation (adequate water intake, slowly increasing fiber, daily stool softener). RD has attached "Constipation Nutrition Therapy" to discharge instructions for patient review at home.   Meal intake: 25-75% (44% average) x 4 documented intakes 1/1-1/2.   Medications reviewed and include: Levaquin, Protonix  LR @ 75 ml/hr  Labs: Na 134 (L), K 3.4 (L) No A1c for review  NUTRITION - FOCUSED PHYSICAL EXAM:  Unable to complete at this time  Diet Order:   Diet Order            Diet - low sodium heart healthy           Diet Heart Room service appropriate? Yes; Fluid consistency: Thin  Diet effective now                 EDUCATION NEEDS:   Education needs have been addressed  Skin:     Last BM:  1/2-type 2 (small;brown)  Height:   Ht Readings from Last 1 Encounters:  12/17/20 5\' 4"  (1.626 m)    Weight:   Wt Readings from Last 1 Encounters:  12/17/20 49.3 kg   BMI:  Body mass index is 18.66 kg/m.  Estimated Nutritional Needs:   Kcal:  1500-1700  Protein:  60-75  Fluid:  >1.2 L/day   02/14/21, RD, LDN Clinical Nutrition After Hours/Weekend Pager # in Amion

## 2020-12-19 LAB — COMPREHENSIVE METABOLIC PANEL
ALT: 36 U/L (ref 0–44)
AST: 32 U/L (ref 15–41)
Albumin: 2.3 g/dL — ABNORMAL LOW (ref 3.5–5.0)
Alkaline Phosphatase: 85 U/L (ref 38–126)
Anion gap: 10 (ref 5–15)
BUN: 19 mg/dL (ref 8–23)
CO2: 19 mmol/L — ABNORMAL LOW (ref 22–32)
Calcium: 8.3 mg/dL — ABNORMAL LOW (ref 8.9–10.3)
Chloride: 104 mmol/L (ref 98–111)
Creatinine, Ser: 0.86 mg/dL (ref 0.44–1.00)
GFR, Estimated: >60 mL/min (ref >60)
Glucose, Bld: 89 mg/dL (ref 70–99)
Potassium: 3.5 mmol/L (ref 3.5–5.1)
Sodium: 133 mmol/L — ABNORMAL LOW (ref 135–145)
Total Bilirubin: 0.7 mg/dL (ref 0.3–1.2)
Total Protein: 5.1 g/dL — ABNORMAL LOW (ref 6.5–8.1)

## 2020-12-19 LAB — CBC WITH DIFFERENTIAL/PLATELET
Abs Immature Granulocytes: 0.06 10*3/uL (ref 0.00–0.07)
Basophils Absolute: 0 10*3/uL (ref 0.0–0.1)
Basophils Relative: 0 %
Eosinophils Absolute: 0 10*3/uL (ref 0.0–0.5)
Eosinophils Relative: 0 %
HCT: 35.6 % — ABNORMAL LOW (ref 36.0–46.0)
Hemoglobin: 12 g/dL (ref 12.0–15.0)
Immature Granulocytes: 0 %
Lymphocytes Relative: 6 %
Lymphs Abs: 0.8 10*3/uL (ref 0.7–4.0)
MCH: 31.4 pg (ref 26.0–34.0)
MCHC: 33.7 g/dL (ref 30.0–36.0)
MCV: 93.2 fL (ref 80.0–100.0)
Monocytes Absolute: 1 10*3/uL (ref 0.1–1.0)
Monocytes Relative: 7 %
Neutro Abs: 11.5 10*3/uL — ABNORMAL HIGH (ref 1.7–7.7)
Neutrophils Relative %: 87 %
Platelets: 201 10*3/uL (ref 150–400)
RBC: 3.82 MIL/uL — ABNORMAL LOW (ref 3.87–5.11)
RDW: 13 % (ref 11.5–15.5)
WBC: 13.4 10*3/uL — ABNORMAL HIGH (ref 4.0–10.5)
nRBC: 0 % (ref 0.0–0.2)

## 2020-12-19 LAB — CULTURE, BLOOD (ROUTINE X 2)

## 2020-12-19 MED ORDER — LEVOFLOXACIN 750 MG PO TABS: 750.0000 mg | ORAL_TABLET | ORAL | 0 refills | Status: DC

## 2020-12-19 NOTE — Progress Notes (Signed)
Patient seen and examined at bedside no distress She is slightly confused at times mistaking time and date but seems a lot more alert and coherent this morning than she was yesterday morning She has been up out of bed and therapy is recommended home health with aide She has no fever no chills overnight and seemed closer to her baseline according to her husband She had abdominal pain yesterday which is better today  I called the micro lab and sensitivities were set up but they are not processed as yet and will take 24 hours Given her clinical stability, I discussed with her options of treatment with Levaquin versus if she worsens to call her PCP and change antibiotics versus coming back to the hospital Family wishes to go home today to complete treatment and return precautions were advised  Pleas Koch, MD [facility] 9:50 AM

## 2020-12-20 LAB — CULTURE, BLOOD (ROUTINE X 2)

## 2020-12-21 LAB — CULTURE, BLOOD (ROUTINE X 2): Culture: NO GROWTH

## 2020-12-22 LAB — CULTURE, BLOOD (ROUTINE X 2): Special Requests: ADEQUATE

## 2021-01-11 DIAGNOSIS — I10 Essential (primary) hypertension: Secondary | ICD-10-CM | POA: Diagnosis not present

## 2021-01-11 DIAGNOSIS — N390 Urinary tract infection, site not specified: Secondary | ICD-10-CM | POA: Diagnosis not present

## 2021-01-11 DIAGNOSIS — D72829 Elevated white blood cell count, unspecified: Secondary | ICD-10-CM | POA: Diagnosis not present

## 2021-01-11 DIAGNOSIS — E871 Hypo-osmolality and hyponatremia: Secondary | ICD-10-CM | POA: Diagnosis not present

## 2021-01-11 DIAGNOSIS — Z09 Encounter for follow-up examination after completed treatment for conditions other than malignant neoplasm: Secondary | ICD-10-CM | POA: Diagnosis not present

## 2021-01-30 DIAGNOSIS — R3915 Urgency of urination: Secondary | ICD-10-CM | POA: Diagnosis not present

## 2021-01-30 DIAGNOSIS — Z681 Body mass index (BMI) 19 or less, adult: Secondary | ICD-10-CM | POA: Diagnosis not present

## 2021-01-30 DIAGNOSIS — G4700 Insomnia, unspecified: Secondary | ICD-10-CM | POA: Diagnosis not present

## 2021-02-13 ENCOUNTER — Other Ambulatory Visit: Payer: Self-pay

## 2021-02-13 ENCOUNTER — Encounter (HOSPITAL_COMMUNITY): Payer: Self-pay

## 2021-02-13 ENCOUNTER — Emergency Department (HOSPITAL_COMMUNITY): Payer: Medicare HMO

## 2021-02-13 ENCOUNTER — Emergency Department (HOSPITAL_COMMUNITY)
Admission: EM | Admit: 2021-02-13 | Discharge: 2021-02-14 | Disposition: A | Discharge status: Home / Self Care | Payer: Medicare HMO | Attending: Emergency Medicine | Admitting: Emergency Medicine

## 2021-02-13 DIAGNOSIS — Z79899 Other long term (current) drug therapy: Secondary | ICD-10-CM | POA: Diagnosis not present

## 2021-02-13 DIAGNOSIS — Z23 Encounter for immunization: Secondary | ICD-10-CM | POA: Diagnosis not present

## 2021-02-13 DIAGNOSIS — S86122A Laceration of other muscle(s) and tendon(s) of posterior muscle group at lower leg level, left leg, initial encounter: Secondary | ICD-10-CM | POA: Diagnosis not present

## 2021-02-13 DIAGNOSIS — W19XXXA Unspecified fall, initial encounter: Secondary | ICD-10-CM

## 2021-02-13 DIAGNOSIS — S81812A Laceration without foreign body, left lower leg, initial encounter: Secondary | ICD-10-CM | POA: Diagnosis not present

## 2021-02-13 DIAGNOSIS — S51011A Laceration without foreign body of right elbow, initial encounter: Secondary | ICD-10-CM | POA: Insufficient documentation

## 2021-02-13 DIAGNOSIS — W01198A Fall on same level from slipping, tripping and stumbling with subsequent striking against other object, initial encounter: Secondary | ICD-10-CM | POA: Insufficient documentation

## 2021-02-13 DIAGNOSIS — S0990XA Unspecified injury of head, initial encounter: Secondary | ICD-10-CM | POA: Insufficient documentation

## 2021-02-13 DIAGNOSIS — Z043 Encounter for examination and observation following other accident: Secondary | ICD-10-CM | POA: Diagnosis not present

## 2021-02-13 DIAGNOSIS — S0993XA Unspecified injury of face, initial encounter: Secondary | ICD-10-CM | POA: Diagnosis not present

## 2021-02-13 DIAGNOSIS — I10 Essential (primary) hypertension: Secondary | ICD-10-CM | POA: Diagnosis not present

## 2021-02-13 DIAGNOSIS — S8992XA Unspecified injury of left lower leg, initial encounter: Secondary | ICD-10-CM | POA: Diagnosis present

## 2021-02-13 MED ORDER — TETANUS-DIPHTH-ACELL PERTUSSIS 5-2.5-18.5 LF-MCG/0.5 IM SUSY
0.5000 mL | PREFILLED_SYRINGE | Freq: Once | INTRAMUSCULAR | Status: AC
  Administered 2021-02-13: 0.5 mL via INTRAMUSCULAR
  Filled 2021-02-13: qty 0.5

## 2021-02-13 MED ORDER — MUPIROCIN 2 % EX OINT: TOPICAL_OINTMENT | Freq: Two times a day (BID) | CUTANEOUS | 0 refills | Status: AC

## 2021-02-13 MED ORDER — LIDOCAINE-EPINEPHRINE-TETRACAINE (LET) TOPICAL GEL
3.0000 mL | Freq: Once | TOPICAL | Status: AC
  Administered 2021-02-13: 3 mL via TOPICAL
  Filled 2021-02-13: qty 3

## 2021-02-13 NOTE — ED Triage Notes (Signed)
Pt reports a fall beside of shower cutting left leg approx 10cm and right elbow.

## 2021-02-14 ENCOUNTER — Emergency Department (HOSPITAL_COMMUNITY): Payer: Medicare HMO

## 2021-02-14 DIAGNOSIS — S0993XA Unspecified injury of face, initial encounter: Secondary | ICD-10-CM | POA: Diagnosis not present

## 2021-02-14 DIAGNOSIS — Z043 Encounter for examination and observation following other accident: Secondary | ICD-10-CM | POA: Diagnosis not present

## 2021-02-14 MED ORDER — BACITRACIN ZINC 500 UNIT/GM EX OINT
TOPICAL_OINTMENT | CUTANEOUS | Status: AC
  Filled 2021-02-14: qty 4.5

## 2021-02-14 MED ORDER — ACETAMINOPHEN 500 MG PO TABS
1000.0000 mg | ORAL_TABLET | Freq: Once | ORAL | Status: AC
  Administered 2021-02-14: 1000 mg via ORAL
  Filled 2021-02-14: qty 2

## 2021-02-14 NOTE — ED Provider Notes (Signed)
[facility] DEPT Provider Note   CSN: [id number] Arrival date & time: 02/13/21  2251     History Chief Complaint  Patient presents with  . Fall    Annette Hernandez is a 80 y.o. female.  The history is provided by the patient.  Fall This is a new problem. The current episode started 1 to 2 hours ago. The problem occurs constantly. The problem has not changed since onset.Pertinent negatives include no chest pain, no abdominal pain and no shortness of breath. Nothing aggravates the symptoms. Nothing relieves the symptoms. She has tried nothing for the symptoms. The treatment provided no relief.  [address] and hit the back of the head lacerated the left calf and obtained a skin tear of the right elbow.       Past Medical History:  Diagnosis Date  . Hypertension     Patient Active Problem List   Diagnosis Date Noted  . UTI (urinary tract infection) 12/17/2020  . Sepsis ([place]) 12/17/2020  . Confusion 12/17/2020  . Essential hypertension 12/17/2020  . Leukocytosis 12/17/2020  . Hyponatremia 12/17/2020  . Weight loss, non-intentional 09/21/2011  . Foreign body of hip or leg, superficial, infected 07/18/2011  . 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 Bacteremia due to Gram-positive bacteria 07/18/2011  . Depression, acute 07/18/2011    History reviewed. No pertinent surgical history.   OB History   No obstetric history on file.     Family History  Problem Relation Age of Onset  . Alzheimer's disease Father   . Breast cancer Neg Hx     Social History   Tobacco Use  . Smoking status: Never Smoker  . Smokeless tobacco: Never Used  Substance Use Topics  . Alcohol use: No  . Drug use: No    Home Medications Prior to Admission medications   Medication Sig Start Date End Date Taking? Authorizing Provider  mupirocin ointment (BACTROBAN) 2 % Apply topically 2 (two) times daily for 7 days. 02/13/21 02/20/21 Yes Milferd Ansell, MD   calcium carbonate (TUMS - DOSED IN MG ELEMENTAL CALCIUM) 500 MG chewable tablet Chew 1 tablet by mouth daily. Patient not taking: Reported on 12/16/2020    [provider]  esomeprazole (NEXIUM) 20 MG packet Take 20 mg by mouth daily before breakfast.    [provider]  levofloxacin (LEVAQUIN) 750 MG tablet Take 1 tablet (750 mg total) by mouth every other day. 12/20/20   Nita Sells, MD  losartan (COZAAR) 100 MG tablet Take 50 mg by mouth daily.    [provider]  Multiple Vitamin (MULTIVITAMIN WITH MINERALS) TABS Take 1 tablet by mouth daily.    [provider]  ondansetron (ZOFRAN) 4 MG tablet Take 1 tablet (4 mg total) by mouth every 8 (eight) hours as needed for nausea. Patient not taking: No sig reported 04/17/13   Margarita Mail, PA-C  oxyCODONE-acetaminophen (PERCOCET) 5-325 MG per tablet Take 1 tablet by mouth every 4 (four) hours as needed for pain. Patient not taking: No sig reported 04/17/13   Margarita Mail, PA-C  zolpidem (AMBIEN) 10 MG tablet Take 5 mg by mouth at bedtime as needed for sleep.    [provider]    Allergies    Amoxicillin-pot clavulanate, Diphenhydramine, and Other  Review of Systems   Review of Systems  Constitutional: Negative for fever.  HENT: Negative for congestion.   Respiratory: Negative for shortness of breath.   Cardiovascular: Negative for chest pain.  Gastrointestinal: Negative  for abdominal pain.  Genitourinary: Negative for difficulty urinating.  Musculoskeletal: Negative for back pain.  Skin: Positive for wound.  Neurological: Negative for dizziness, weakness and numbness.  Psychiatric/Behavioral: Negative for agitation.  All other systems reviewed and are negative.   Physical Exam Updated Vital Signs BP 138/79 (BP Location: Right Arm)   Pulse 87   Temp 97.9 F (36.6 C) (Oral)   Resp 18   Ht 5\' 3"  (1.6 m)   Wt 45.4 kg   SpO2 100%   BMI 17.71 kg/m   Physical Exam Vitals and  nursing note reviewed.  Constitutional:      General: She is not in acute distress.    Appearance: Normal appearance.  HENT:     Head: Normocephalic and atraumatic.     Nose: Nose normal.  Eyes:     Conjunctiva/sclera: Conjunctivae normal.     Pupils: Pupils are equal, round, and reactive to light.  Cardiovascular:     Rate and Rhythm: Normal rate and regular rhythm.     Pulses: Normal pulses.     Heart sounds: Normal heart sounds.  Pulmonary:     Effort: Pulmonary effort is normal.     Breath sounds: Normal breath sounds.  Abdominal:     General: Abdomen is flat. Bowel sounds are normal.     Palpations: Abdomen is soft.     Tenderness: There is no abdominal tenderness. There is no guarding.  Musculoskeletal:       Arms:     Cervical back: Normal range of motion and neck supple. No tenderness.       Legs:  Neurological:     Mental Status: She is alert.     ED Results / Procedures / Treatments   Labs (all labs ordered are listed, but only abnormal results are displayed) Labs Reviewed - No data to display  EKG None  Radiology DG Tibia/Fibula Left  Result Date: 02/13/2021 CLINICAL DATA:  [address] [address], laceration EXAM: LEFT TIBIA AND FIBULA - 2 VIEW COMPARISON:  04/17/2013 FINDINGS: Frontal and lateral views of the left tibia and fibula are obtained. No acute displaced fracture. Left knee and ankle are well aligned. Large laceration posterior left calf. IMPRESSION: 1. Posterior calf laceration.  No underlying fracture. Electronically Signed   By: Randa Ngo M.D.   On: 02/13/2021 23:40    Procedures .Marland KitchenLaceration Repair  Date/Time: 02/14/2021 1:13 AM Performed by: Veatrice Kells, MD Authorized by: Veatrice Kells, MD   Consent:    Consent obtained:  Verbal   Consent given by:  Patient   Risks, benefits, and alternatives were discussed: yes     Risks discussed:  Infection, need for additional repair, nerve damage, pain, poor cosmetic result, poor wound healing, retained  foreign body, tendon damage and vascular damage   Alternatives discussed:  No treatment Universal protocol:    Patient identity confirmed:  Arm band Anesthesia:    Anesthesia method:  Topical application   Topical anesthetic:  LET Laceration details:    Location:  Leg   Leg location:  L lower leg   Length (cm):  6   Depth (mm):  2 Pre-procedure details:    Preparation:  Patient was prepped and draped in usual sterile fashion Exploration:    Limited defect created (wound extended): no     Hemostasis achieved with:  Direct pressure   Wound extent: no areolar tissue violation noted and no foreign bodies/material noted     Contaminated: no   Treatment:    Area  cleansed with:  Chlorhexidine and povidone-iodine   Amount of cleaning:  Extensive   Irrigation solution:  Sterile saline   Debridement:  None   Undermining:  None   Scar revision: no   Skin repair:    Repair method:  Staples   Number of staples:  12 Approximation:    Approximation:  Close Repair type:    Repair type:  Intermediate Post-procedure details:    Dressing:  Sterile dressing   Procedure completion:  Tolerated well, no immediate complications     Medications Ordered in ED Medications  acetaminophen (TYLENOL) tablet 1,000 mg (has no administration in time range)  bacitracin 500 UNIT/GM ointment (has no administration in time range)  Tdap (BOOSTRIX) injection 0.5 mL (0.5 mLs Intramuscular Given 02/13/21 2324)  lidocaine-EPINEPHrine-tetracaine (LET) topical gel (3 mLs Topical Given 02/13/21 2324)    ED Course  I have reviewed the triage vital signs and the nursing notes.  Pertinent labs & imaging results that were available during my care of the patient were reviewed by me and considered in my medical decision making (see chart for details).   use antibiotic ointment, keep clean and dry.  Staple removal in 10 days at urgent care.  Strict return for purulent drainage, streaking, swelling redness or warmth or  any concerns.  Return immediately.  Annette Hernandez was evaluated in Emergency Department on 02/14/2021 for the symptoms described in the history of present illness. She was evaluated in the context of the global COVID-19 pandemic, which necessitated consideration that the patient might be at risk for infection with the SARS-CoV-2 virus that causes COVID-19. Institutional protocols and algorithms that pertain to the evaluation of patients at risk for COVID-19 are in a state of rapid change based on information released by regulatory bodies including the CDC and federal and state organizations. These policies and algorithms were followed during the patient's care in the ED.  Final Clinical Impression(s) / ED Diagnoses Final diagnoses:  Fall, initial encounter  Laceration of left lower leg, initial encounter   Return for intractable cough, coughing up blood, fevers >100.4 unrelieved by medication, shortness of breath, intractable vomiting, chest pain, shortness of breath, weakness, numbness, changes in speech, facial asymmetry, abdominal pain, passing out, Inability to tolerate liquids or food, cough, altered mental status or any concerns. No signs of systemic illness or infection. The patient is nontoxic-appearing on exam and vital signs are within normal limits.  I have reviewed the triage vital signs and the nursing notes. Pertinent labs & imaging results that were available during my care of the patient were reviewed by me and considered in my medical decision making (see chart for details). After history, exam, and medical workup I feel the patient has been appropriately medically screened and is safe for discharge home. Pertinent diagnoses were discussed with the patient. Patient was given return precautions. Rx / DC Orders ED Discharge Orders         Ordered    mupirocin ointment (BACTROBAN) 2 %  2 times daily        02/13/21 2313           Celine Dishman, MD 02/14/21 0354

## 2021-02-23 DIAGNOSIS — S81812D Laceration without foreign body, left lower leg, subsequent encounter: Secondary | ICD-10-CM | POA: Diagnosis not present

## 2021-02-27 DIAGNOSIS — S81812D Laceration without foreign body, left lower leg, subsequent encounter: Secondary | ICD-10-CM | POA: Diagnosis not present

## 2021-02-28 ENCOUNTER — Other Ambulatory Visit: Payer: Medicare HMO

## 2021-02-28 ENCOUNTER — Ambulatory Visit: Payer: Medicare HMO

## 2021-03-02 DIAGNOSIS — N390 Urinary tract infection, site not specified: Secondary | ICD-10-CM | POA: Diagnosis not present

## 2021-03-14 DIAGNOSIS — Z681 Body mass index (BMI) 19 or less, adult: Secondary | ICD-10-CM | POA: Diagnosis not present

## 2021-03-14 DIAGNOSIS — R3915 Urgency of urination: Secondary | ICD-10-CM | POA: Diagnosis not present

## 2021-03-14 DIAGNOSIS — N390 Urinary tract infection, site not specified: Secondary | ICD-10-CM | POA: Diagnosis not present

## 2021-03-27 DIAGNOSIS — N3000 Acute cystitis without hematuria: Secondary | ICD-10-CM | POA: Diagnosis not present

## 2021-04-06 DIAGNOSIS — Z85828 Personal history of other malignant neoplasm of skin: Secondary | ICD-10-CM | POA: Diagnosis not present

## 2021-04-06 DIAGNOSIS — C44529 Squamous cell carcinoma of skin of other part of trunk: Secondary | ICD-10-CM | POA: Diagnosis not present

## 2021-04-06 DIAGNOSIS — D485 Neoplasm of uncertain behavior of skin: Secondary | ICD-10-CM | POA: Diagnosis not present

## 2021-04-06 DIAGNOSIS — L723 Sebaceous cyst: Secondary | ICD-10-CM | POA: Diagnosis not present

## 2021-04-06 DIAGNOSIS — L821 Other seborrheic keratosis: Secondary | ICD-10-CM | POA: Diagnosis not present

## 2021-04-24 DIAGNOSIS — L988 Other specified disorders of the skin and subcutaneous tissue: Secondary | ICD-10-CM | POA: Diagnosis not present

## 2021-04-24 DIAGNOSIS — C44529 Squamous cell carcinoma of skin of other part of trunk: Secondary | ICD-10-CM | POA: Diagnosis not present

## 2021-04-24 DIAGNOSIS — Z85828 Personal history of other malignant neoplasm of skin: Secondary | ICD-10-CM | POA: Diagnosis not present

## 2021-04-25 DIAGNOSIS — H52203 Unspecified astigmatism, bilateral: Secondary | ICD-10-CM | POA: Diagnosis not present

## 2021-04-25 DIAGNOSIS — H524 Presbyopia: Secondary | ICD-10-CM | POA: Diagnosis not present

## 2021-04-25 DIAGNOSIS — H5213 Myopia, bilateral: Secondary | ICD-10-CM | POA: Diagnosis not present

## 2021-07-05 ENCOUNTER — Other Ambulatory Visit: Payer: Self-pay

## 2021-07-05 ENCOUNTER — Ambulatory Visit
Admission: RE | Admit: 2021-07-05 | Discharge: 2021-07-05 | Disposition: A | Discharge status: Home / Self Care | Payer: Medicare HMO | Source: Ambulatory Visit | Attending: Family Medicine | Admitting: Family Medicine

## 2021-07-05 DIAGNOSIS — Z1231 Encounter for screening mammogram for malignant neoplasm of breast: Secondary | ICD-10-CM | POA: Diagnosis not present

## 2021-07-12 DIAGNOSIS — R296 Repeated falls: Secondary | ICD-10-CM | POA: Diagnosis not present

## 2021-07-12 DIAGNOSIS — R634 Abnormal weight loss: Secondary | ICD-10-CM | POA: Diagnosis not present

## 2021-07-12 DIAGNOSIS — N390 Urinary tract infection, site not specified: Secondary | ICD-10-CM | POA: Diagnosis not present

## 2021-07-12 DIAGNOSIS — D692 Other nonthrombocytopenic purpura: Secondary | ICD-10-CM | POA: Diagnosis not present

## 2021-07-12 DIAGNOSIS — I10 Essential (primary) hypertension: Secondary | ICD-10-CM | POA: Diagnosis not present

## 2021-07-12 DIAGNOSIS — Z681 Body mass index (BMI) 19 or less, adult: Secondary | ICD-10-CM | POA: Diagnosis not present

## 2021-07-12 DIAGNOSIS — G4700 Insomnia, unspecified: Secondary | ICD-10-CM | POA: Diagnosis not present

## 2021-08-18 DIAGNOSIS — R319 Hematuria, unspecified: Secondary | ICD-10-CM | POA: Diagnosis not present

## 2021-08-18 DIAGNOSIS — R82998 Other abnormal findings in urine: Secondary | ICD-10-CM | POA: Diagnosis not present

## 2021-09-15 DIAGNOSIS — M545 Low back pain, unspecified: Secondary | ICD-10-CM | POA: Diagnosis not present

## 2021-10-16 DIAGNOSIS — R350 Frequency of micturition: Secondary | ICD-10-CM | POA: Diagnosis not present

## 2021-10-16 DIAGNOSIS — R82998 Other abnormal findings in urine: Secondary | ICD-10-CM | POA: Diagnosis not present

## 2021-10-26 ENCOUNTER — Other Ambulatory Visit: Payer: Self-pay

## 2021-10-26 ENCOUNTER — Ambulatory Visit
Admission: RE | Admit: 2021-10-26 | Discharge: 2021-10-26 | Disposition: A | Discharge status: Home / Self Care | Payer: Medicare HMO | Source: Ambulatory Visit | Attending: Family Medicine | Admitting: Family Medicine

## 2021-10-26 DIAGNOSIS — M810 Age-related osteoporosis without current pathological fracture: Secondary | ICD-10-CM | POA: Diagnosis not present

## 2021-10-26 DIAGNOSIS — M85851 Other specified disorders of bone density and structure, right thigh: Secondary | ICD-10-CM | POA: Diagnosis not present

## 2021-10-26 DIAGNOSIS — Z780 Asymptomatic menopausal state: Secondary | ICD-10-CM | POA: Diagnosis not present

## 2021-10-30 DIAGNOSIS — R69 Illness, unspecified: Secondary | ICD-10-CM | POA: Diagnosis not present

## 2021-11-01 DIAGNOSIS — R8290 Unspecified abnormal findings in urine: Secondary | ICD-10-CM | POA: Diagnosis not present

## 2021-11-01 DIAGNOSIS — N3001 Acute cystitis with hematuria: Secondary | ICD-10-CM | POA: Diagnosis not present

## 2021-11-01 DIAGNOSIS — R82998 Other abnormal findings in urine: Secondary | ICD-10-CM | POA: Diagnosis not present

## 2021-11-20 DIAGNOSIS — Z0000 Encounter for general adult medical examination without abnormal findings: Secondary | ICD-10-CM | POA: Diagnosis not present

## 2021-11-20 DIAGNOSIS — Z1389 Encounter for screening for other disorder: Secondary | ICD-10-CM | POA: Diagnosis not present

## 2021-12-20 DIAGNOSIS — N3020 Other chronic cystitis without hematuria: Secondary | ICD-10-CM | POA: Diagnosis not present

## 2021-12-20 DIAGNOSIS — N393 Stress incontinence (female) (male): Secondary | ICD-10-CM | POA: Diagnosis not present

## 2021-12-20 DIAGNOSIS — N816 Rectocele: Secondary | ICD-10-CM | POA: Diagnosis not present

## 2021-12-20 DIAGNOSIS — N952 Postmenopausal atrophic vaginitis: Secondary | ICD-10-CM | POA: Diagnosis not present

## 2021-12-21 IMAGING — MG MM DIGITAL SCREENING BILAT W/ TOMO AND CAD
8 series · 9 of 24 positions shown · non-contrast
Comparison: Previous exam(s).

CLINICAL DATA: Screening.

EXAM:
DIGITAL SCREENING BILATERAL MAMMOGRAM WITH TOMOSYNTHESIS AND CAD
TECHNIQUE: Bilateral screening digital craniocaudal and mediolateral oblique
mammograms were obtained. Bilateral screening digital breast
tomosynthesis was performed. The images were evaluated with
computer-aided detection.

[R CC synth-2D]
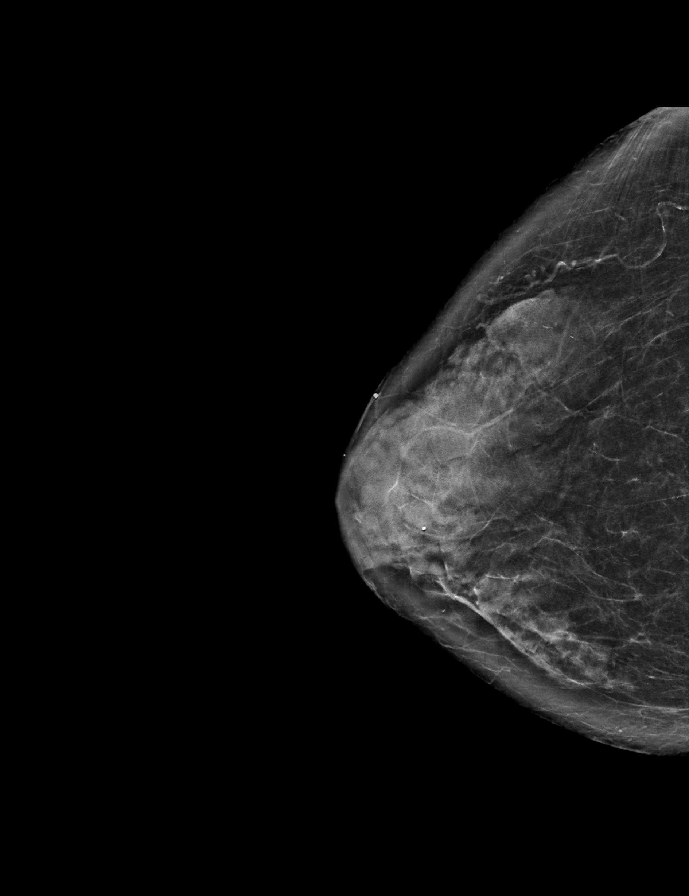

[L MLO synth-2D]
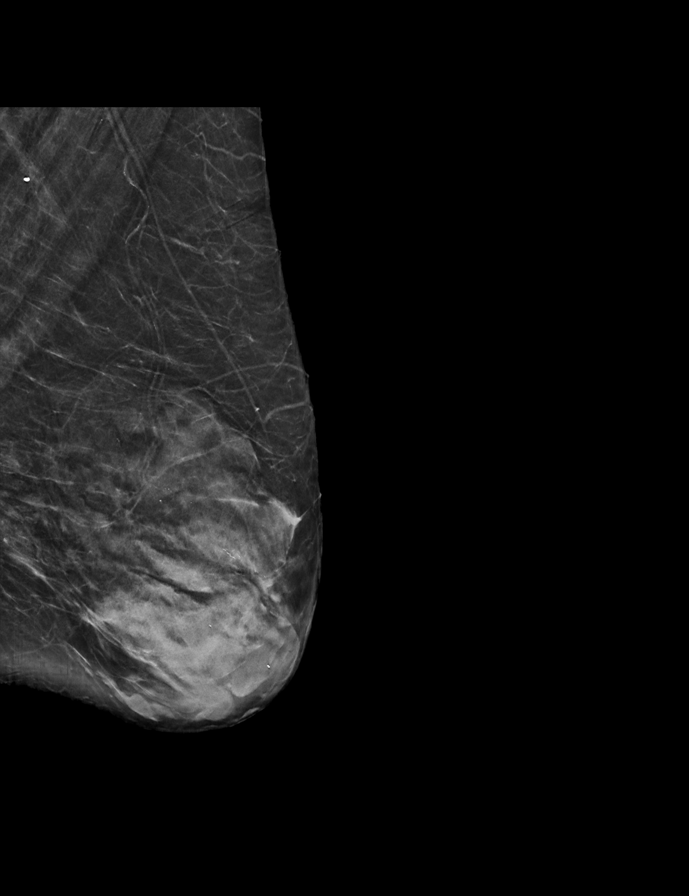

[R MLO synth-2D]
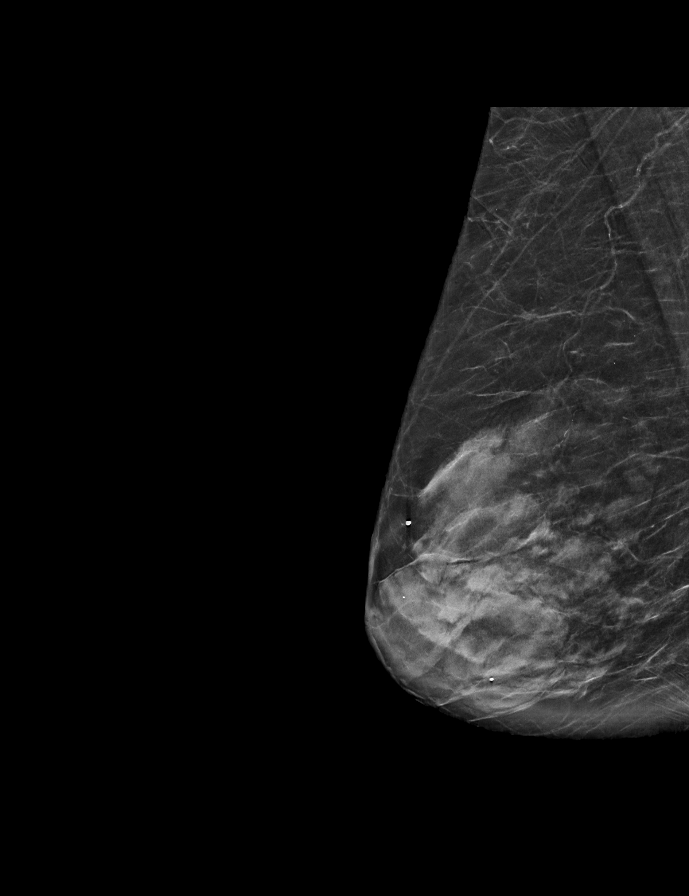

[L CC synth-2D]
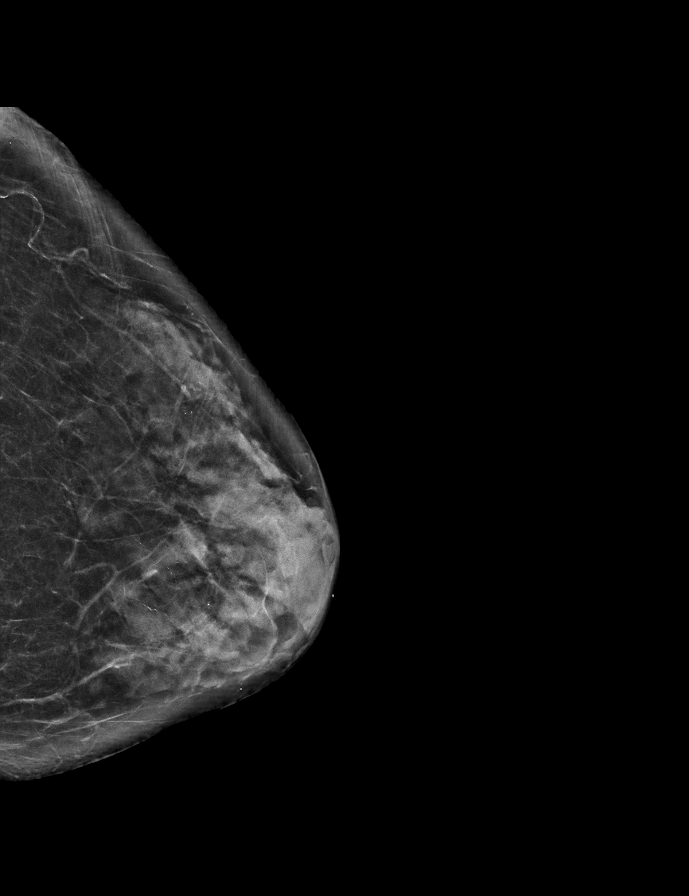

[R CC tomo · 2 of 60 frames shown]
[frame 20/60]
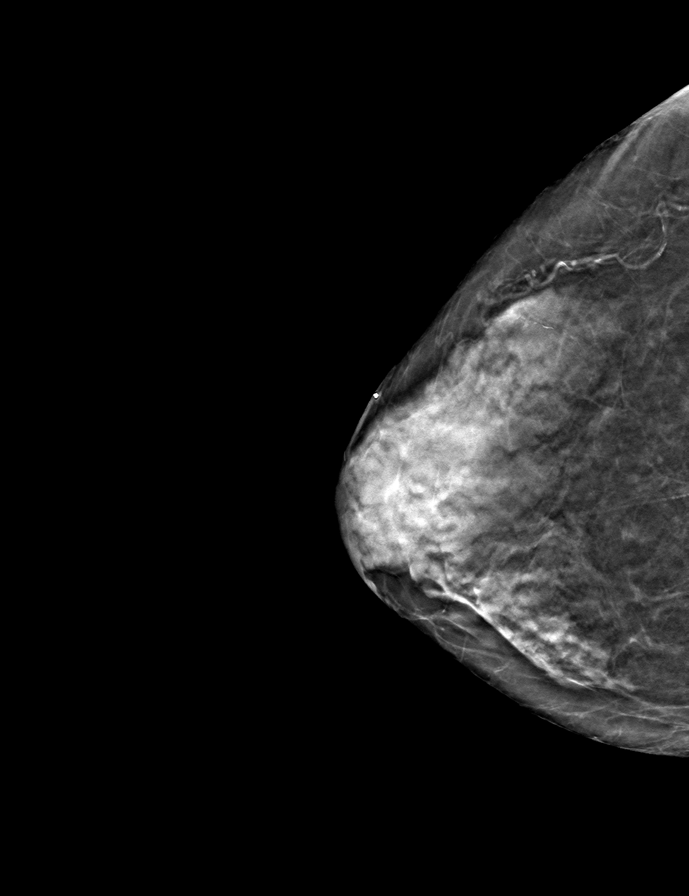
[frame 31/60]
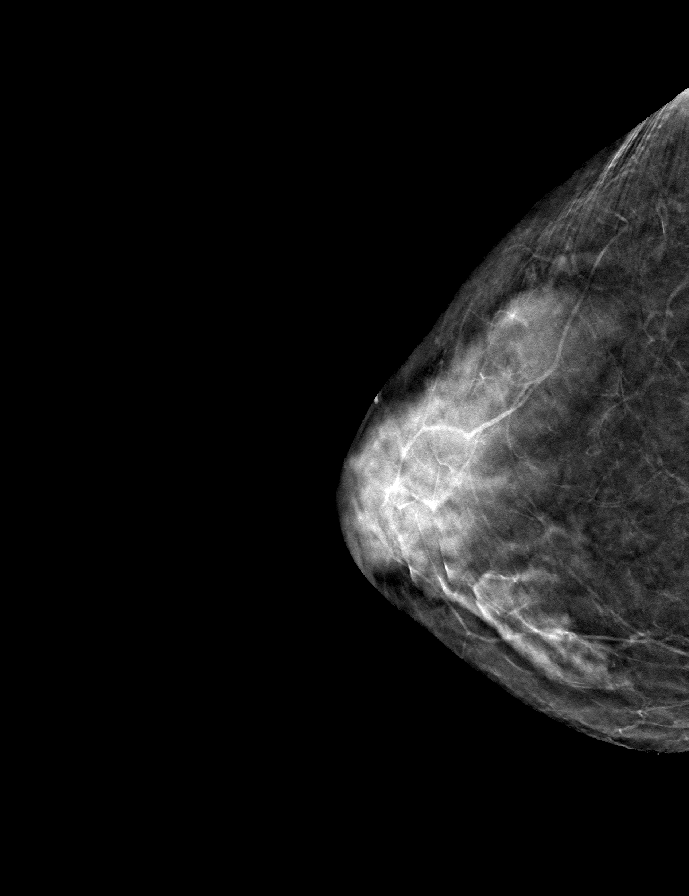

[R MLO tomo · tomo slice 31/61.0]
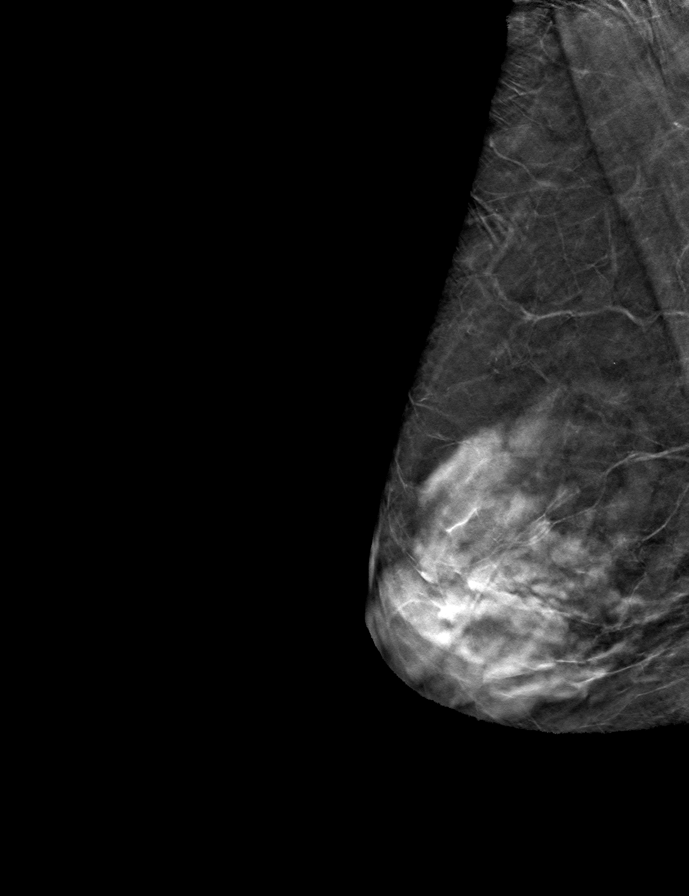

[L MLO tomo · tomo slice 30/59.0]
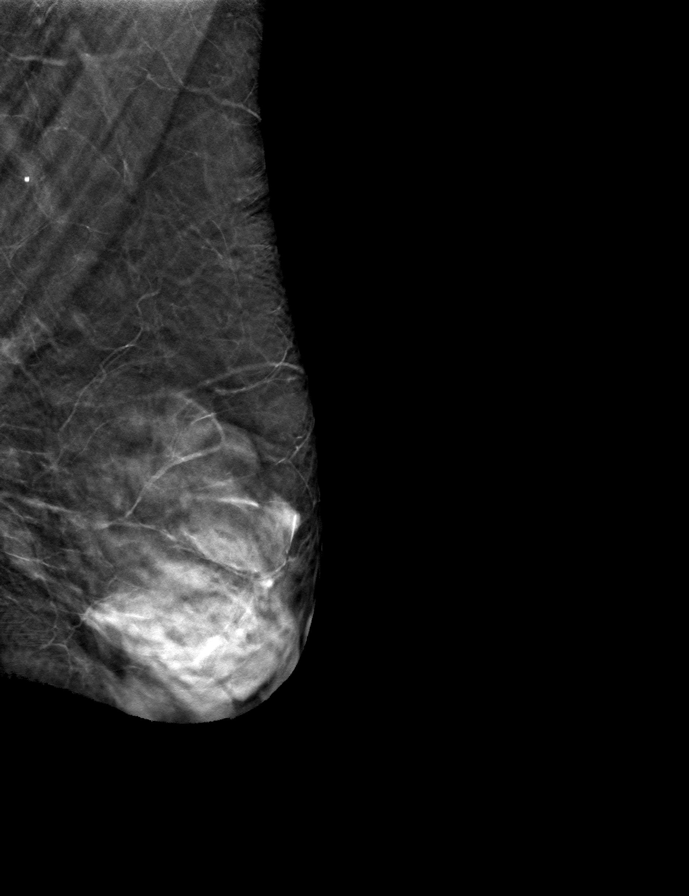

[L CC tomo · tomo slice 28/55.0]
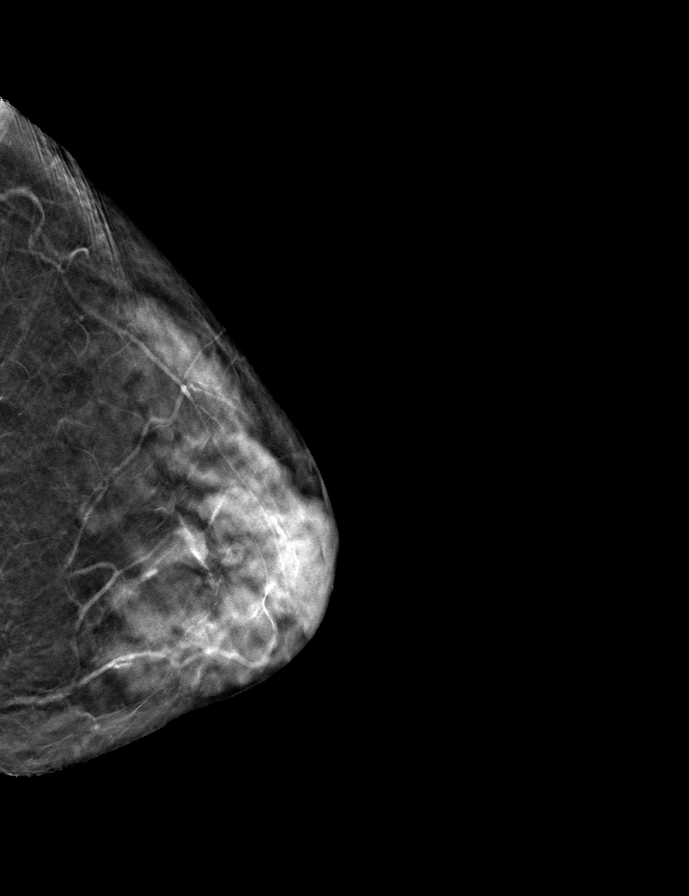

[9 of 24 positions shown; findings below may reference images not displayed]

ACR Breast Density Category d: The breast tissue is extremely dense,
which lowers the sensitivity of mammography
FINDINGS: There are no findings suspicious for malignancy.
IMPRESSION: No mammographic evidence of malignancy. A result letter of this
screening mammogram will be mailed directly to the patient.

RECOMMENDATION:
Screening mammogram in one year. (Code:TA-V-WV9)

BI-RADS CATEGORY  1: Negative.

## 2021-12-28 DIAGNOSIS — Z85828 Personal history of other malignant neoplasm of skin: Secondary | ICD-10-CM | POA: Diagnosis not present

## 2021-12-28 DIAGNOSIS — L570 Actinic keratosis: Secondary | ICD-10-CM | POA: Diagnosis not present

## 2021-12-29 DIAGNOSIS — N3000 Acute cystitis without hematuria: Secondary | ICD-10-CM | POA: Diagnosis not present

## 2021-12-29 DIAGNOSIS — N3020 Other chronic cystitis without hematuria: Secondary | ICD-10-CM | POA: Diagnosis not present

## 2022-01-11 DIAGNOSIS — N3941 Urge incontinence: Secondary | ICD-10-CM | POA: Diagnosis not present

## 2022-01-11 DIAGNOSIS — R350 Frequency of micturition: Secondary | ICD-10-CM | POA: Diagnosis not present

## 2022-01-11 DIAGNOSIS — N3020 Other chronic cystitis without hematuria: Secondary | ICD-10-CM | POA: Diagnosis not present

## 2022-01-11 DIAGNOSIS — R351 Nocturia: Secondary | ICD-10-CM | POA: Diagnosis not present

## 2022-01-25 DIAGNOSIS — N3020 Other chronic cystitis without hematuria: Secondary | ICD-10-CM | POA: Diagnosis not present

## 2022-01-25 DIAGNOSIS — N3941 Urge incontinence: Secondary | ICD-10-CM | POA: Diagnosis not present

## 2022-02-05 DIAGNOSIS — N390 Urinary tract infection, site not specified: Secondary | ICD-10-CM | POA: Diagnosis not present

## 2022-02-05 DIAGNOSIS — K219 Gastro-esophageal reflux disease without esophagitis: Secondary | ICD-10-CM | POA: Diagnosis not present

## 2022-02-05 DIAGNOSIS — G4700 Insomnia, unspecified: Secondary | ICD-10-CM | POA: Diagnosis not present

## 2022-02-05 DIAGNOSIS — M859 Disorder of bone density and structure, unspecified: Secondary | ICD-10-CM | POA: Diagnosis not present

## 2022-02-05 DIAGNOSIS — D692 Other nonthrombocytopenic purpura: Secondary | ICD-10-CM | POA: Diagnosis not present

## 2022-02-05 DIAGNOSIS — I10 Essential (primary) hypertension: Secondary | ICD-10-CM | POA: Diagnosis not present

## 2022-02-15 DIAGNOSIS — N3946 Mixed incontinence: Secondary | ICD-10-CM | POA: Diagnosis not present

## 2022-02-15 DIAGNOSIS — R350 Frequency of micturition: Secondary | ICD-10-CM | POA: Diagnosis not present

## 2022-02-15 DIAGNOSIS — N3020 Other chronic cystitis without hematuria: Secondary | ICD-10-CM | POA: Diagnosis not present

## 2022-03-16 DIAGNOSIS — H6122 Impacted cerumen, left ear: Secondary | ICD-10-CM | POA: Diagnosis not present

## 2022-04-05 DIAGNOSIS — K219 Gastro-esophageal reflux disease without esophagitis: Secondary | ICD-10-CM | POA: Diagnosis not present

## 2022-04-05 DIAGNOSIS — I10 Essential (primary) hypertension: Secondary | ICD-10-CM | POA: Diagnosis not present

## 2022-04-05 DIAGNOSIS — E7800 Pure hypercholesterolemia, unspecified: Secondary | ICD-10-CM | POA: Diagnosis not present

## 2022-04-05 DIAGNOSIS — G4700 Insomnia, unspecified: Secondary | ICD-10-CM | POA: Diagnosis not present

## 2022-04-06 DIAGNOSIS — N3946 Mixed incontinence: Secondary | ICD-10-CM | POA: Diagnosis not present

## 2022-04-06 DIAGNOSIS — N130 Hydronephrosis with ureteropelvic junction obstruction: Secondary | ICD-10-CM | POA: Diagnosis not present

## 2022-04-06 DIAGNOSIS — N3020 Other chronic cystitis without hematuria: Secondary | ICD-10-CM | POA: Diagnosis not present

## 2022-04-26 DIAGNOSIS — H5213 Myopia, bilateral: Secondary | ICD-10-CM | POA: Diagnosis not present

## 2022-04-26 DIAGNOSIS — Z961 Presence of intraocular lens: Secondary | ICD-10-CM | POA: Diagnosis not present

## 2022-04-26 DIAGNOSIS — H52203 Unspecified astigmatism, bilateral: Secondary | ICD-10-CM | POA: Diagnosis not present

## 2022-04-26 DIAGNOSIS — H524 Presbyopia: Secondary | ICD-10-CM | POA: Diagnosis not present

## 2022-05-18 DIAGNOSIS — K219 Gastro-esophageal reflux disease without esophagitis: Secondary | ICD-10-CM | POA: Diagnosis not present

## 2022-05-18 DIAGNOSIS — I10 Essential (primary) hypertension: Secondary | ICD-10-CM | POA: Diagnosis not present

## 2022-05-18 DIAGNOSIS — G4700 Insomnia, unspecified: Secondary | ICD-10-CM | POA: Diagnosis not present

## 2022-05-18 DIAGNOSIS — M8580 Other specified disorders of bone density and structure, unspecified site: Secondary | ICD-10-CM | POA: Diagnosis not present

## 2022-05-18 DIAGNOSIS — E7800 Pure hypercholesterolemia, unspecified: Secondary | ICD-10-CM | POA: Diagnosis not present

## 2022-08-06 DIAGNOSIS — L600 Ingrowing nail: Secondary | ICD-10-CM | POA: Diagnosis not present

## 2022-08-06 DIAGNOSIS — B351 Tinea unguium: Secondary | ICD-10-CM | POA: Diagnosis not present

## 2022-08-06 DIAGNOSIS — M79671 Pain in right foot: Secondary | ICD-10-CM | POA: Diagnosis not present

## 2022-08-13 DIAGNOSIS — U071 COVID-19: Secondary | ICD-10-CM | POA: Diagnosis not present

## 2022-09-26 DIAGNOSIS — K219 Gastro-esophageal reflux disease without esophagitis: Secondary | ICD-10-CM | POA: Diagnosis not present

## 2022-09-26 DIAGNOSIS — E7800 Pure hypercholesterolemia, unspecified: Secondary | ICD-10-CM | POA: Diagnosis not present

## 2022-09-26 DIAGNOSIS — I10 Essential (primary) hypertension: Secondary | ICD-10-CM | POA: Diagnosis not present

## 2022-11-01 DIAGNOSIS — G4700 Insomnia, unspecified: Secondary | ICD-10-CM | POA: Diagnosis not present

## 2022-11-01 DIAGNOSIS — E7800 Pure hypercholesterolemia, unspecified: Secondary | ICD-10-CM | POA: Diagnosis not present

## 2022-11-01 DIAGNOSIS — K219 Gastro-esophageal reflux disease without esophagitis: Secondary | ICD-10-CM | POA: Diagnosis not present

## 2022-11-01 DIAGNOSIS — I10 Essential (primary) hypertension: Secondary | ICD-10-CM | POA: Diagnosis not present

## 2023-04-17 DIAGNOSIS — R8271 Bacteriuria: Secondary | ICD-10-CM | POA: Diagnosis not present

## 2023-04-17 DIAGNOSIS — N3946 Mixed incontinence: Secondary | ICD-10-CM | POA: Diagnosis not present

## 2023-04-17 DIAGNOSIS — N3020 Other chronic cystitis without hematuria: Secondary | ICD-10-CM | POA: Diagnosis not present

## 2023-04-22 ENCOUNTER — Other Ambulatory Visit: Payer: Self-pay | Admitting: Urology

## 2023-04-23 ENCOUNTER — Other Ambulatory Visit: Payer: Self-pay | Admitting: Urology

## 2023-04-30 DIAGNOSIS — H5213 Myopia, bilateral: Secondary | ICD-10-CM | POA: Diagnosis not present

## 2023-04-30 DIAGNOSIS — H52203 Unspecified astigmatism, bilateral: Secondary | ICD-10-CM | POA: Diagnosis not present

## 2023-04-30 DIAGNOSIS — H524 Presbyopia: Secondary | ICD-10-CM | POA: Diagnosis not present

## 2023-04-30 DIAGNOSIS — Z961 Presence of intraocular lens: Secondary | ICD-10-CM | POA: Diagnosis not present

## 2023-05-07 ENCOUNTER — Encounter (HOSPITAL_BASED_OUTPATIENT_CLINIC_OR_DEPARTMENT_OTHER): Payer: Self-pay | Admitting: Urology

## 2023-05-07 NOTE — Progress Notes (Signed)
Spoke w/ via phone for pre-op interview--- Annette Hernandez needs dos----   EKG per anesthesia            Hernandez results------ COVID test -----patient states asymptomatic no test needed Arrive at -------1100 NPO after MN NO Solid Food.  Clear liquids from MN until---1000 Med rec completed Medications to take morning of surgery -----Nexium Diabetic medication ----- Patient instructed no nail polish to be worn day of surgery Patient instructed to bring photo id and insurance card day of surgery Patient aware to have Driver (ride ) / caregiver  [name]  for 24 hours after surgery  Patient Special Instructions ----- Pre-Op special Instructions ----- Patient verbalized understanding of instructions that were given at this phone interview. Patient denies shortness of breath, chest pain, fever, cough at this phone interview.

## 2023-05-08 DIAGNOSIS — Z0000 Encounter for general adult medical examination without abnormal findings: Secondary | ICD-10-CM | POA: Diagnosis not present

## 2023-05-08 DIAGNOSIS — Z1389 Encounter for screening for other disorder: Secondary | ICD-10-CM | POA: Diagnosis not present

## 2023-05-08 DIAGNOSIS — N3946 Mixed incontinence: Secondary | ICD-10-CM | POA: Diagnosis not present

## 2023-05-08 DIAGNOSIS — I10 Essential (primary) hypertension: Secondary | ICD-10-CM | POA: Diagnosis not present

## 2023-05-08 DIAGNOSIS — Z6820 Body mass index (BMI) 20.0-20.9, adult: Secondary | ICD-10-CM | POA: Diagnosis not present

## 2023-05-08 DIAGNOSIS — G4700 Insomnia, unspecified: Secondary | ICD-10-CM | POA: Diagnosis not present

## 2023-05-21 ENCOUNTER — Encounter (HOSPITAL_BASED_OUTPATIENT_CLINIC_OR_DEPARTMENT_OTHER): Payer: Self-pay | Admitting: Urology

## 2023-05-21 ENCOUNTER — Ambulatory Visit (HOSPITAL_BASED_OUTPATIENT_CLINIC_OR_DEPARTMENT_OTHER)
Admission: RE | Admit: 2023-05-21 | Discharge: 2023-05-21 | Disposition: A | Discharge status: Home / Self Care | Payer: Medicare HMO | Attending: Urology | Admitting: Urology

## 2023-05-21 ENCOUNTER — Ambulatory Visit (HOSPITAL_BASED_OUTPATIENT_CLINIC_OR_DEPARTMENT_OTHER): Payer: Medicare HMO | Admitting: Anesthesiology

## 2023-05-21 ENCOUNTER — Other Ambulatory Visit: Payer: Self-pay

## 2023-05-21 ENCOUNTER — Encounter (HOSPITAL_BASED_OUTPATIENT_CLINIC_OR_DEPARTMENT_OTHER)
Admission: RE | Disposition: A | Discharge status: Home / Self Care | Payer: Self-pay | Source: Home / Self Care | Attending: Urology

## 2023-05-21 DIAGNOSIS — N3946 Mixed incontinence: Secondary | ICD-10-CM

## 2023-05-21 DIAGNOSIS — R351 Nocturia: Secondary | ICD-10-CM | POA: Insufficient documentation

## 2023-05-21 DIAGNOSIS — Z87891 Personal history of nicotine dependence: Secondary | ICD-10-CM | POA: Insufficient documentation

## 2023-05-21 DIAGNOSIS — N3281 Overactive bladder: Secondary | ICD-10-CM | POA: Diagnosis not present

## 2023-05-21 DIAGNOSIS — N3020 Other chronic cystitis without hematuria: Secondary | ICD-10-CM | POA: Diagnosis not present

## 2023-05-21 DIAGNOSIS — I10 Essential (primary) hypertension: Secondary | ICD-10-CM | POA: Diagnosis not present

## 2023-05-21 DIAGNOSIS — N393 Stress incontinence (female) (male): Secondary | ICD-10-CM | POA: Diagnosis not present

## 2023-05-21 HISTORY — DX: Gastro-esophageal reflux disease without esophagitis: K21.9

## 2023-05-21 HISTORY — PX: CYSTOSCOPY WITH INJECTION: SHX1424

## 2023-05-21 LAB — POCT I-STAT, CHEM 8
BUN: 20 mg/dL (ref 8–23)
Calcium, Ion: 1.3 mmol/L (ref 1.15–1.40)
Chloride: 105 mmol/L (ref 98–111)
Creatinine, Ser: 0.9 mg/dL (ref 0.44–1.00)
Glucose, Bld: 92 mg/dL (ref 70–99)
HCT: 41 % (ref 36.0–46.0)
Hemoglobin: 13.9 g/dL (ref 12.0–15.0)
Potassium: 4 mmol/L (ref 3.5–5.1)
Sodium: 138 mmol/L (ref 135–145)
TCO2: 25 mmol/L (ref 22–32)

## 2023-05-21 SURGERY — CYSTOSCOPY, WITH INJECTION OF BLADDER NECK OR BLADDER WALL
Anesthesia: General | Site: Urethra

## 2023-05-21 MED ORDER — LIDOCAINE HCL (PF) 2 % IJ SOLN
INTRAMUSCULAR | Status: AC
  Filled 2023-05-21: qty 5

## 2023-05-21 MED ORDER — LIDOCAINE 2% (20 MG/ML) 5 ML SYRINGE
INTRAMUSCULAR | Status: DC | PRN
  Administered 2023-05-21: 60 mg via INTRAVENOUS

## 2023-05-21 MED ORDER — LACTATED RINGERS IV SOLN: INTRAVENOUS | Status: DC

## 2023-05-21 MED ORDER — CIPROFLOXACIN IN D5W 400 MG/200ML IV SOLN
400.0000 mg | Freq: Two times a day (BID) | INTRAVENOUS | Status: DC
  Administered 2023-05-21: 400 mg via INTRAVENOUS

## 2023-05-21 MED ORDER — OXYCODONE HCL 5 MG/5ML PO SOLN: 5.0000 mg | Freq: Once | ORAL | Status: DC | PRN

## 2023-05-21 MED ORDER — ONDANSETRON HCL 4 MG/2ML IJ SOLN: 4.0000 mg | Freq: Four times a day (QID) | INTRAMUSCULAR | Status: DC | PRN

## 2023-05-21 MED ORDER — FENTANYL CITRATE (PF) 100 MCG/2ML IJ SOLN: 25.0000 ug | INTRAMUSCULAR | Status: DC | PRN

## 2023-05-21 MED ORDER — DEXAMETHASONE SODIUM PHOSPHATE 10 MG/ML IJ SOLN
INTRAMUSCULAR | Status: AC
  Filled 2023-05-21: qty 1

## 2023-05-21 MED ORDER — ONDANSETRON HCL 4 MG/2ML IJ SOLN
INTRAMUSCULAR | Status: AC
  Filled 2023-05-21: qty 2

## 2023-05-21 MED ORDER — DEXMEDETOMIDINE HCL IN NACL 80 MCG/20ML IV SOLN
INTRAVENOUS | Status: DC | PRN
  Administered 2023-05-21: 4 ug via INTRAVENOUS

## 2023-05-21 MED ORDER — FENTANYL CITRATE (PF) 100 MCG/2ML IJ SOLN
INTRAMUSCULAR | Status: AC
  Filled 2023-05-21: qty 2

## 2023-05-21 MED ORDER — ONABOTULINUMTOXINA 100 UNITS IJ SOLR
INTRAMUSCULAR | Status: DC | PRN
  Administered 2023-05-21: 75 [IU] via INTRAMUSCULAR

## 2023-05-21 MED ORDER — ONDANSETRON HCL 4 MG/2ML IJ SOLN
INTRAMUSCULAR | Status: DC | PRN
  Administered 2023-05-21: 4 mg via INTRAVENOUS

## 2023-05-21 MED ORDER — CIPROFLOXACIN IN D5W 400 MG/200ML IV SOLN
INTRAVENOUS | Status: AC
  Filled 2023-05-21: qty 200

## 2023-05-21 MED ORDER — OXYCODONE HCL 5 MG PO TABS: 5.0000 mg | ORAL_TABLET | Freq: Once | ORAL | Status: DC | PRN

## 2023-05-21 MED ORDER — PROPOFOL 10 MG/ML IV BOLUS
INTRAVENOUS | Status: DC | PRN
  Administered 2023-05-21 (×2): 10 mg via INTRAVENOUS
  Administered 2023-05-21: 40 mg via INTRAVENOUS
  Administered 2023-05-21 (×3): 10 mg via INTRAVENOUS

## 2023-05-21 MED ORDER — FENTANYL CITRATE (PF) 100 MCG/2ML IJ SOLN
INTRAMUSCULAR | Status: DC | PRN
  Administered 2023-05-21: 25 ug via INTRAVENOUS
  Administered 2023-05-21: 50 ug via INTRAVENOUS

## 2023-05-21 MED ORDER — PROPOFOL 10 MG/ML IV BOLUS
INTRAVENOUS | Status: AC
  Filled 2023-05-21: qty 20

## 2023-05-21 SURGICAL SUPPLY — 20 items
BAG DRAIN URO-CYSTO SKYTR STRL (DRAIN) ×2 IMPLANT
BAG DRN UROCATH (DRAIN) ×1
CLOTH BEACON ORANGE TIMEOUT ST (SAFETY) ×2 IMPLANT
ELECT REM PT RETURN 9FT ADLT (ELECTROSURGICAL) IMPLANT
ELECTRODE REM PT RTRN 9FT ADLT (ELECTROSURGICAL) ×2 IMPLANT
GLOVE BIO SURGEON STRL SZ 6.5 (GLOVE) ×2 IMPLANT
GOWN STRL REUS W/TWL LRG LVL3 (GOWN DISPOSABLE) ×2 IMPLANT
KIT TURNOVER CYSTO (KITS) ×2 IMPLANT
MANIFOLD NEPTUNE II (INSTRUMENTS) ×2 IMPLANT
NDL ASPIRATION 22 (NEEDLE) ×2 IMPLANT
NDL SAFETY ECLIP 18X1.5 (MISCELLANEOUS) ×2 IMPLANT
NEEDLE ASPIRATION 22 (NEEDLE) ×1 IMPLANT
PACK CYSTO (CUSTOM PROCEDURE TRAY) ×2 IMPLANT
SLEEVE SCD COMPRESS KNEE MED (STOCKING) ×2 IMPLANT
SYR 20ML LL LF (SYRINGE) ×2 IMPLANT
SYR CONTROL 10ML LL (SYRINGE) ×2 IMPLANT
SYSTEM URETHRAL BULK BULKAMID (Female Continence) IMPLANT
TUBE CONNECTING 12X1/4 (SUCTIONS) ×2 IMPLANT
TUBING UROLOGY SET (TUBING) ×2 IMPLANT
WATER STERILE IRR 3000ML UROMA (IV SOLUTION) ×2 IMPLANT

## 2023-05-21 NOTE — Discharge Instructions (Addendum)
Cystoscopy with Bulkamid and Botox patient instructions  Following a cystoscopy, a catheter (a flexible rubber tube) is sometimes left in place to empty the bladder. This may cause some discomfort or a feeling that you need to urinate. Your doctor determines the period of time that the catheter will be left in place. You may have bloody urine for two to three days (Call your doctor if the amount of bleeding increases or does not subside).  You may pass blood clots in your urine, especially if you had a biopsy. It is not unusual to pass small blood clots and have some bloody urine a couple of weeks after your cystoscopy. Again, call your doctor if the bleeding does not subside. You may have: Dysuria (painful urination) Frequency (urinating often) Urgency (strong desire to urinate)  These symptoms are common especially if medicine is instilled into the bladder or a ureteral stent is placed. Avoiding alcohol and caffeine, such as coffee, tea, and chocolate, may help relieve these symptoms. Drink plenty of water, unless otherwise instructed. Your doctor may also prescribe an antibiotic or other medicine to reduce these symptoms.  Cystoscopy results are available soon after the procedure; biopsy results usually take two to four days. Your doctor will discuss the results of your exam with you. Before you go home, you will be given specific instructions for follow-up care. Special Instructions:   If you are going home with a catheter in place do not take a tub bath until removed by your doctor.   You may resume your normal activities.   Do not drive or operate machinery if you are taking narcotic pain medicine.   Be sure to keep all follow-up appointments with your doctor.   Call Your Doctor If: The catheter is not draining You have severe pain You are unable to urinate You have a fever over 101 You have severe bleeding          Post Anesthesia Home Care Instructions  Activity: Get plenty of  rest for the remainder of the day. A responsible individual must stay with you for 24 hours following the procedure.  For the next 24 hours, DO NOT: -Drive a car -Advertising copywriter -Drink alcoholic beverages -Take any medication unless instructed by your physician -Make any legal decisions or sign important papers.  Meals: Start with liquid foods such as gelatin or soup. Progress to regular foods as tolerated. Avoid greasy, spicy, heavy foods. If nausea and/or vomiting occur, drink only clear liquids until the nausea and/or vomiting subsides. Call your physician if vomiting continues.  Special Instructions/Symptoms: Your throat may feel dry or sore from the anesthesia or the breathing tube placed in your throat during surgery. If this causes discomfort, gargle with warm salt water. The discomfort should disappear within 24 hours.

## 2023-05-21 NOTE — Interval H&P Note (Signed)
History and Physical Interval Note:  05/21/2023 2:12 PM  Annette Hernandez  has presented today for surgery, with the diagnosis of MIXED URINARY INCONTINENCE.  The various methods of treatment have been discussed with the patient and family. After consideration of risks, benefits and other options for treatment, the patient has consented to  Procedure(s) with comments: CYSTOSCOPY WITH BULKAMID INJECTION, AND BOTOX 75 UNITS (N/A) - 45 MINUTES as a surgical intervention.  The patient's history has been reviewed, patient examined, no change in status, stable for surgery.  I have reviewed the patient's chart and labs.  Questions were answered to the patient's satisfaction.     Rudell Marlowe D Artemus Romanoff

## 2023-05-21 NOTE — Anesthesia Preprocedure Evaluation (Signed)
Anesthesia Evaluation  Patient identified by MRN, date of birth, ID band Patient awake    Reviewed: Allergy & Precautions, H&P , NPO status , Patient's Chart, lab work & pertinent test results  Airway Mallampati: II   Neck ROM: full    Dental   Pulmonary neg pulmonary ROS   breath sounds clear to auscultation       Cardiovascular hypertension,  Rhythm:regular Rate:Normal     Neuro/Psych    GI/Hepatic ,GERD  ,,  Endo/Other    Renal/GU      Musculoskeletal   Abdominal   Peds  Hematology   Anesthesia Other Findings   Reproductive/Obstetrics                             Anesthesia Physical Anesthesia Plan  ASA: 2  Anesthesia Plan: General   Post-op Pain Management:    Induction: Intravenous  PONV Risk Score and Plan: 3 and Ondansetron, Dexamethasone and Treatment may vary due to age or medical condition  Airway Management Planned: LMA  Additional Equipment:   Intra-op Plan:   Post-operative Plan: Extubation in OR  Informed Consent: I have reviewed the patients History and Physical, chart, labs and discussed the procedure including the risks, benefits and alternatives for the proposed anesthesia with the patient or authorized representative who has indicated his/her understanding and acceptance.     Dental advisory given  Plan Discussed with: CRNA, Anesthesiologist and Surgeon  Anesthesia Plan Comments:        Anesthesia Quick Evaluation

## 2023-05-21 NOTE — Op Note (Signed)
Operative Note   Preoperative diagnosis:  1.  Stress urinary incontinence 2.  Overactive bladder     Postoperative diagnosis: 1.  Stress urinary incontinence 2.  Overactive bladder   Procedure(s): 1.  Cystoscopy with injection of bulkamid 2.  Cystoscopy with injection of 75 units of Botox 3.  Urethral dilation with sounds   Surgeon: Kasandra Knudsen, MD   Assistants:  None   Anesthesia:  General   Complications:  None   EBL:  minimal   Specimens: 1. none   Drains/Catheters: 1.  none   Intraoperative findings:   Normal urethra Bilateral orthotopic Uos visualized at start and end of case Scattered cystitis cystica noted  No bladder masses or stones   Indication: 82 year old woman with symptomatic mixed urinary incontinence.     Description of procedure:   After risks and benefits of the procedure discussed with the patient, informed consent was obtained.  The patient was taken to the operating placed in the supine position.  Anesthesia was induced and antibiotics were administered.  The patient was then repositioned in the dorsolithotomy position.  She was prepped and draped in usual sterile fashion a timeout performed with the attending present.  The urethra was dilated with female sounds from 14Fr to 24Fr to accommodate the cystoscope. The injection cystoscope was placed in the eighth meatus and advanced in the bladder and direct visualization.  75 units mixed in 7.5 cc of sterile saline were injected on posterior wall of bladder taking care of avoid ureteral orifices.  The cystoscope was assembled with the Bulkamid system.  It was then placed in the urethral meatus and advanced into the bladder under direct visualization.  Prior cystoscopy had been done which noted normal anatomic landmarks.  These were again verified during cystoscopy today.  The cystoscope was brought back to the bladder neck and the needle was advanced through the needle guide at the 1 o'clock  position.  Once it was visualized and advanced it was rotated to the 5 o'clock position.  Bulkamid was then injected until blood was seen.  This was then repeated at the 1 o'clock position in the 7 o'clock position until coaptation was noted.   This concluded the case.  The patient's bladder was left with approximately 200 cc of sterile saline.  The patient emerged from anesthesia and was transferred the PACU in stable condition.   Plan:  Plan for patient to void in PACU prior to discharge.

## 2023-05-21 NOTE — Transfer of Care (Signed)
Immediate Anesthesia Transfer of Care Note  Patient: Annette Hernandez  Procedure(s) Performed: CYSTOSCOPY WITH BULKAMID INJECTION, AND BOTOX 75 UNITS (Urethra)  Patient Location: PACU  Anesthesia Type:MAC  Level of Consciousness: awake, alert , and oriented  Airway & Oxygen Therapy: Patient Spontanous Breathing  Post-op Assessment: Report given to RN and Post -op Vital signs reviewed and stable  Post vital signs: Reviewed and stable  Last Vitals:  Vitals Value Taken Time  BP 112/62 05/21/23 1506  Temp 36.5 C 05/21/23 1506  Pulse 69 05/21/23 1508  Resp 13 05/21/23 1508  SpO2 98 % 05/21/23 1508  Vitals shown include unvalidated device data.  Last Pain:  Vitals:   05/21/23 1123  TempSrc: Oral  PainSc: 0-No pain      Patients Stated Pain Goal: 6 (05/21/23 1123)  Complications: No notable events documented.

## 2023-05-21 NOTE — H&P (Signed)
CC/HPI: cc: mixed urinary incontinence   04/17/23: 82 yo woman with hx of mixed urinary incontinence who previously saw Dr. Sherron Monday. She has tried [place] and [employer] without improvement in symptoms. She voids every 1.5 hours. She has to change her pad 4-5 times a day. She leaks when moving from sitting to standing. Urodynamic study showed positive instability as well as stress urinary incontinence. Although she did not urinate on her own during UDS, she voided as soon as she got off the table. She is also been treated for recurrent UTIs. She is miserable due to the leakage.    UDS SUMMARY  Annette Hernandez held a max capacity of approx. 400 mls. Her 1st sensation was felt at 300 mls. There was positive SUI. She leaked a minor amount with coughing when her bladder was full. There was positive low amplitude instability. Patient felt a strong desire but did not leak. She did not generate a voluntary contraction and void. No trabeculation was noted. No reflux was noted.     ALLERGIES: No Allergies    MEDICATIONS: Nexium  Losartan Potassium 100 mg tablet Oral  Multi-Day Vitamins tablet Oral  Zolpidem Tartrate 10 MG Oral Tablet Oral     GU PSH: Catheterization For Collection Of Specimen, Single Patient, All Places Of Service - 12/29/2021 Catheterize For Residual - 12/29/2021 Complex cystometrogram, w/ void pressure and urethral pressure profile studies, any technique - 01/25/2022 Complex Uroflow - 01/25/2022 Cystoscopy - 02/15/2022 Emg surf Electrd - 01/25/2022 Hysterectomy Unilat SO - 2016 Inject For cystogram - 01/25/2022 Intrabd voidng Press - 01/25/2022 Simple MMK - 2016       PSH Notes: Total Abdominal Hysterectomy With Removal Of Both Ovaries, Anterior Vesicourethropexy, Cesarean Section, Hip Surgery   NON-GU PSH: Cesarean Delivery Only - 2016         GU PMH: Chronic cystitis (w/o hematuria) - 04/06/2022, - 02/15/2022, - 01/11/2022, Her urine looks infected today. I will get a culture and treat then  initiate suppressive therapy since she has had multiple infections over the past year. , - 12/20/2021 Mixed incontinence - 04/06/2022, - 02/15/2022 Urinary Frequency (Stable) - 02/15/2022, - 01/11/2022 Stress Incontinence - 01/25/2022, She has severe SUI particularly with the small rectocele reduced. I will refer her to Dr. Sherron Monday for further evaluation and treatment. I briefly mentioned that she might be a candidate for a bulking agent. , - 12/20/2021, Female stress incontinence, - 2017 Urge incontinence - 01/25/2022, - 01/11/2022 Nocturia - 01/11/2022 Acute Cystitis/UTI - 12/29/2021, - 2022 (Worsening), She has a recurrent UTI with symptoms for 2 days. , - 2017 Postmenopausal atrophic vaginitis, She has severe atrophy. She was previously given estrace cream but that was listed as discontinued several months ago. She may benefit from resumption of topical estrogen, but I will defer to Dr. Sherron Monday on that. - 12/20/2021, Atrophic vaginitis, - 2016 Rectocele, She has a small rectocele that appears to offer some urethral compression. - 12/20/2021, Rectocele, female, - 2016 Renal calculus (Stable), Left, 7mm LLP stone. 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), Interstitial cystitis - 2016 Urinary Urgency, Urinary urgency - 2016    NON-GU PMH: Oliguria - 2019 Encounter for general adult medical examination without abnormal findings, Encounter for preventive health examination - 2017 Bacteriuria, Bacteriuria - 2016 Calculus of gallbladder  without cholecystitis without obstruction, Gallstone - 2016 Personal history of other diseases of the circulatory system, History of cardiac murmur - 2016, History of hypertension, - 2016 Personal history of other  diseases of the digestive system, History of esophageal reflux - 2016 Personal history of other diseases of the musculoskeletal system and connective tissue, History of osteoporosis - 2016    FAMILY HISTORY: Alzheimer's Disease - Runs In Family   SOCIAL HISTORY: Marital Status: Married Preferred Language: English; Ethnicity: Not Hispanic Or Latino; Race: White Current Smoking Status: Patient does not smoke anymore.  Drinks 2 caffeinated drinks per day.     Notes: Never a smoker, Two children, Minimum alcohol consumption, Married, Daily caffeine consumption, 2-3 servings a day   REVIEW OF SYSTEMS:     GU Review Female:  Patient denies frequent urination, hard to postpone urination, burning /pain with urination, get up at night to urinate, leakage of urine, stream starts and stops, trouble starting your stream, have to strain to urinate, and being pregnant.    Gastrointestinal (Upper):  Patient denies nausea, vomiting, and indigestion/ heartburn.    Gastrointestinal (Lower):  Patient denies diarrhea and constipation.    Constitutional:  Patient denies fever, night sweats, weight loss, and fatigue.    Skin:  Patient denies skin rash/ lesion and itching.    Eyes:  Patient denies blurred vision and double vision.    Ears/ Nose/ Throat:  Patient denies sore throat and sinus problems.    Hematologic/Lymphatic:  Patient denies swollen glands and easy bruising.    Cardiovascular:  Patient denies leg swelling and chest pains.    Respiratory:  Patient denies cough and shortness of breath.    Endocrine:  Patient denies excessive thirst.    Musculoskeletal:  Patient denies back pain and joint pain.    Neurological:  Patient denies headaches and dizziness.    Psychologic:  Patient denies depression and anxiety.    VITAL SIGNS:       04/17/2023 02:00 PM     Weight 111 lb / 50.35 kg     Height 63 in / 160.02 cm     BP 145/80 mmHg     Heart Rate 64 /min     Temperature 97.7 F / 36.5 C     BMI 19.7  kg/m     MULTI-SYSTEM PHYSICAL EXAMINATION:      Constitutional: Well-nourished. No physical deformities. Normally developed. Good grooming.     Neck: Neck symmetrical, not swollen. Normal tracheal position.     Respiratory: No labored breathing, no use of accessory muscles.      Skin: No paleness, no jaundice, no cyanosis. No lesion, no ulcer, no rash.     Neurologic / Psychiatric: Oriented to time, oriented to place, oriented to person. No depression, no anxiety, no agitation.     Eyes: Normal conjunctivae. Normal eyelids.     Ears, Nose, Mouth, and Throat: Left ear no scars, no lesions, no masses. Right ear no scars, no lesions, no masses. Nose no scars, no lesions, no masses. Normal hearing. Normal lips.     Musculoskeletal: Normal gait and station of head and neck.            Complexity of Data:   Records Review:  Previous Patient Records, POC Tool  Urine Test Review:  Urinalysis  Urodynamics Review:  Review Bladder Scan, Review Urodynamics Tests   Notes:  02/05/2022: BUN 23, creatinine 0.96, GFR 59   PROCEDURES:    Urinalysis w/Scope  Dipstick Dipstick  Cont'd Micro  Color: Yellow Bilirubin: Neg mg/dL WBC/hpf: 40 - 09/WJX  Appearance: Cloudy Ketones: Neg mg/dL RBC/hpf: 10 - 91/YNW  Specific Gravity: 1.025 Blood: 2+ ery/uL Bacteria: Many (>50/hpf)  pH: 5.5 Protein: Trace mg/dL Cystals: Amorph Urates  Glucose: Neg mg/dL Urobilinogen: 0.2 mg/dL Casts: Hyaline   Nitrites: Positive Trichomonas: Not Present   Leukocyte Esterase: 3+ leu/uL Mucous: Present    Epithelial Cells: 6 - 10/hpf    Yeast: NS (Not Seen)    Sperm: Not Present    ASSESSMENT:     ICD-10 Details  1 GU:  Mixed incontinence - N39.46 Chronic, Stable  2  Nocturia - R35.1 Chronic, Stable  3  Chronic cystitis (w/o hematuria) - N30.20 Chronic, Stable   PLAN:   Orders  Labs CULTURE, URINE  Document  Letter(s):  Created for Patient: Clinical Summary   Notes:  Mixed urinary incontinence:  -I reviewed  patient's prior office visit notes as well as urodynamics results  -We discussed the possibility of Bulkamid with or without Botox injections and we also discussed InterStim  -Patient understands the risk of urinary retention following Botox injections and Bulkamid. We discussed that Botox does wear off after 4 to 6 months. Risks and benefits of Botox and Bulkamid discussed in detail including but not limited to pain, bleeding, infection, damage to surrounding structures, hematuria, urinary retention.   Send urine for culture. May start antibiotics leading up to procedure.

## 2023-05-22 ENCOUNTER — Encounter (HOSPITAL_BASED_OUTPATIENT_CLINIC_OR_DEPARTMENT_OTHER): Payer: Self-pay | Admitting: Urology

## 2023-05-22 NOTE — Anesthesia Postprocedure Evaluation (Signed)
Anesthesia Post Note  Patient: Annette Hernandez  Procedure(s) Performed: CYSTOSCOPY WITH BULKAMID INJECTION, AND BOTOX 75 UNITS (Urethra)     Patient location during evaluation: PACU Anesthesia Type: MAC Level of consciousness: awake and alert Pain management: pain level controlled Vital Signs Assessment: post-procedure vital signs reviewed and stable Respiratory status: spontaneous breathing, nonlabored ventilation, respiratory function stable and patient connected to nasal cannula oxygen Cardiovascular status: stable and blood pressure returned to baseline Postop Assessment: no apparent nausea or vomiting Anesthetic complications: no   No notable events documented.  Last Vitals:  Vitals:   05/21/23 1530 05/21/23 1641  BP: 129/65 (!) 160/64  Pulse:  66  Resp: 15 12  Temp: (!) 36.4 C 36.6 C  SpO2: 99% 97%    Last Pain:  Vitals:   05/21/23 1641  TempSrc:   PainSc: 0-No pain                 Setsuko Robins S

## 2023-06-04 DIAGNOSIS — N3946 Mixed incontinence: Secondary | ICD-10-CM | POA: Diagnosis not present

## 2023-06-14 DIAGNOSIS — M545 Low back pain, unspecified: Secondary | ICD-10-CM | POA: Diagnosis not present

## 2023-06-21 ENCOUNTER — Other Ambulatory Visit: Payer: Self-pay | Admitting: Family Medicine

## 2023-06-21 DIAGNOSIS — Z1231 Encounter for screening mammogram for malignant neoplasm of breast: Secondary | ICD-10-CM

## 2023-06-26 ENCOUNTER — Ambulatory Visit
Admission: RE | Admit: 2023-06-26 | Discharge: 2023-06-26 | Disposition: A | Discharge status: Home / Self Care | Payer: Medicare HMO | Source: Ambulatory Visit | Attending: Family Medicine | Admitting: Family Medicine

## 2023-06-26 DIAGNOSIS — Z1231 Encounter for screening mammogram for malignant neoplasm of breast: Secondary | ICD-10-CM

## 2023-08-02 DIAGNOSIS — Z0100 Encounter for examination of eyes and vision without abnormal findings: Secondary | ICD-10-CM | POA: Diagnosis not present

## 2024-05-13 DIAGNOSIS — Z1331 Encounter for screening for depression: Secondary | ICD-10-CM | POA: Diagnosis not present

## 2024-05-13 DIAGNOSIS — I10 Essential (primary) hypertension: Secondary | ICD-10-CM | POA: Diagnosis not present

## 2024-05-13 DIAGNOSIS — Z23 Encounter for immunization: Secondary | ICD-10-CM | POA: Diagnosis not present

## 2024-05-13 DIAGNOSIS — K219 Gastro-esophageal reflux disease without esophagitis: Secondary | ICD-10-CM | POA: Diagnosis not present

## 2024-05-13 DIAGNOSIS — Z6820 Body mass index (BMI) 20.0-20.9, adult: Secondary | ICD-10-CM | POA: Diagnosis not present

## 2024-05-13 DIAGNOSIS — F320 Major depressive disorder, single episode, mild: Secondary | ICD-10-CM | POA: Diagnosis not present

## 2024-05-13 DIAGNOSIS — H6122 Impacted cerumen, left ear: Secondary | ICD-10-CM | POA: Diagnosis not present

## 2024-05-13 DIAGNOSIS — Z0000 Encounter for general adult medical examination without abnormal findings: Secondary | ICD-10-CM | POA: Diagnosis not present

## 2024-05-13 DIAGNOSIS — G4700 Insomnia, unspecified: Secondary | ICD-10-CM | POA: Diagnosis not present

## 2024-05-31 DIAGNOSIS — T148XXA Other injury of unspecified body region, initial encounter: Secondary | ICD-10-CM | POA: Diagnosis not present

## 2024-06-17 DIAGNOSIS — Z0100 Encounter for examination of eyes and vision without abnormal findings: Secondary | ICD-10-CM | POA: Diagnosis not present

## 2024-06-17 DIAGNOSIS — H43393 Other vitreous opacities, bilateral: Secondary | ICD-10-CM | POA: Diagnosis not present

## 2024-06-25 DIAGNOSIS — M542 Cervicalgia: Secondary | ICD-10-CM | POA: Diagnosis not present

## 2024-07-13 DIAGNOSIS — M542 Cervicalgia: Secondary | ICD-10-CM | POA: Diagnosis not present

## 2024-07-17 DIAGNOSIS — M542 Cervicalgia: Secondary | ICD-10-CM | POA: Diagnosis not present

## 2024-07-29 DIAGNOSIS — M542 Cervicalgia: Secondary | ICD-10-CM | POA: Diagnosis not present

## 2024-08-02 ENCOUNTER — Emergency Department (HOSPITAL_COMMUNITY)

## 2024-08-02 ENCOUNTER — Emergency Department (HOSPITAL_COMMUNITY)
Admission: EM | Admit: 2024-08-02 | Discharge: 2024-08-02 | Disposition: A | Discharge status: Home / Self Care | Attending: Emergency Medicine | Admitting: Emergency Medicine

## 2024-08-02 DIAGNOSIS — Z96649 Presence of unspecified artificial hip joint: Secondary | ICD-10-CM | POA: Insufficient documentation

## 2024-08-02 DIAGNOSIS — M542 Cervicalgia: Secondary | ICD-10-CM | POA: Diagnosis present

## 2024-08-02 DIAGNOSIS — Z79899 Other long term (current) drug therapy: Secondary | ICD-10-CM | POA: Insufficient documentation

## 2024-08-02 DIAGNOSIS — I10 Essential (primary) hypertension: Secondary | ICD-10-CM | POA: Diagnosis not present

## 2024-08-02 DIAGNOSIS — M978XXA Periprosthetic fracture around other internal prosthetic joint, initial encounter: Secondary | ICD-10-CM | POA: Diagnosis not present

## 2024-08-02 DIAGNOSIS — Z471 Aftercare following joint replacement surgery: Secondary | ICD-10-CM | POA: Diagnosis not present

## 2024-08-02 DIAGNOSIS — M9702XA Periprosthetic fracture around internal prosthetic left hip joint, initial encounter: Secondary | ICD-10-CM | POA: Diagnosis not present

## 2024-08-02 DIAGNOSIS — M25552 Pain in left hip: Secondary | ICD-10-CM | POA: Diagnosis not present

## 2024-08-02 DIAGNOSIS — Z96642 Presence of left artificial hip joint: Secondary | ICD-10-CM | POA: Diagnosis not present

## 2024-08-02 LAB — TYPE AND SCREEN
ABO/RH(D): O POS
Antibody Screen: NEGATIVE

## 2024-08-02 LAB — CBC WITH DIFFERENTIAL/PLATELET
Abs Immature Granulocytes: 0.04 K/uL (ref 0.00–0.07)
Basophils Absolute: 0.1 K/uL (ref 0.0–0.1)
Basophils Relative: 0 %
Eosinophils Absolute: 0 K/uL (ref 0.0–0.5)
Eosinophils Relative: 0 %
HCT: 36.6 % (ref 36.0–46.0)
Hemoglobin: 12 g/dL (ref 12.0–15.0)
Immature Granulocytes: 0 %
Lymphocytes Relative: 13 %
Lymphs Abs: 1.5 K/uL (ref 0.7–4.0)
MCH: 30.2 pg (ref 26.0–34.0)
MCHC: 32.8 g/dL (ref 30.0–36.0)
MCV: 92 fL (ref 80.0–100.0)
Monocytes Absolute: 0.9 K/uL (ref 0.1–1.0)
Monocytes Relative: 8 %
Neutro Abs: 8.9 K/uL — ABNORMAL HIGH (ref 1.7–7.7)
Neutrophils Relative %: 79 %
Platelets: 253 K/uL (ref 150–400)
RBC: 3.98 MIL/uL (ref 3.87–5.11)
RDW: 12.2 % (ref 11.5–15.5)
WBC: 11.4 K/uL — ABNORMAL HIGH (ref 4.0–10.5)
nRBC: 0 % (ref 0.0–0.2)

## 2024-08-02 LAB — BASIC METABOLIC PANEL WITH GFR
Anion gap: 11 (ref 5–15)
BUN: 14 mg/dL (ref 8–23)
CO2: 23 mmol/L (ref 22–32)
Calcium: 9.5 mg/dL (ref 8.9–10.3)
Chloride: 99 mmol/L (ref 98–111)
Creatinine, Ser: 0.72 mg/dL (ref 0.44–1.00)
GFR, Estimated: >60 mL/min (ref >60)
Glucose, Bld: 126 mg/dL — ABNORMAL HIGH (ref 70–99)
Potassium: 3.7 mmol/L (ref 3.5–5.1)
Sodium: 133 mmol/L — ABNORMAL LOW (ref 135–145)

## 2024-08-02 LAB — PROTIME-INR
INR: 1.1 (ref 0.8–1.2)
Prothrombin Time: 14.7 s (ref 11.4–15.2)

## 2024-08-02 MED ORDER — OXYCODONE-ACETAMINOPHEN 5-325 MG PO TABS: 1.0000 | ORAL_TABLET | Freq: Four times a day (QID) | ORAL | 0 refills | Status: DC | PRN

## 2024-08-02 MED ORDER — MORPHINE SULFATE (PF) 4 MG/ML IV SOLN
4.0000 mg | Freq: Once | INTRAVENOUS | Status: AC
  Administered 2024-08-02: 4 mg via INTRAVENOUS
  Filled 2024-08-02: qty 1

## 2024-08-02 NOTE — ED Provider Notes (Signed)
 [place] EMERGENCY DEPARTMENT AT [facility] Provider Note   CSN: [id number] Arrival date & time: 08/02/24  1357     Patient presents with: Annette Hernandez Annette Hernandez is a 83 y.o. female.    Fall     Patient has a history of hypertension acid reflux, cervical spine pain.  Patient states she had a fall last evening.  Patient stumbled and landed on her left hip.  Patient states she has sharp stabbing pain in her hip whenever she tries to walk now.  She did have surgery in that area previously Prior to Admission medications   Medication Sig Start Date End Date Taking? Authorizing Provider  calcium carbonate (TUMS - DOSED IN MG ELEMENTAL CALCIUM) 500 MG chewable tablet Chew 1 tablet by mouth daily.    [provider]  esomeprazole  (NEXIUM ) 20 MG packet Take 20 mg by mouth daily before breakfast.    [provider]  losartan  (COZAAR ) 100 MG tablet Take 50 mg by mouth daily.    [provider]  Multiple Vitamin (MULTIVITAMIN WITH MINERALS) TABS Take 1 tablet by mouth daily.    [provider]  ondansetron  (ZOFRAN ) 4 MG tablet Take 1 tablet (4 mg total) by mouth every 8 (eight) hours as needed for nausea. Patient not taking: No sig reported 04/17/13   Harris, Abigail, PA-C  oxyCODONE -acetaminophen  (PERCOCET) 5-325 MG per tablet Take 1 tablet by mouth every 4 (four) hours as needed for pain. Patient not taking: No sig reported 04/17/13   Arloa Chroman, PA-C  zolpidem  (AMBIEN ) 10 MG tablet Take 5 mg by mouth at bedtime as needed for sleep.    [provider]    Allergies: Amoxicillin-pot clavulanate, Diphenhydramine, and Other    Review of Systems  Updated Vital Signs BP (!) 114/59 (BP Location: Right Arm)   Pulse 95   Temp 98.2 F (36.8 C) (Oral)   Resp 18   Ht 1.6 m (5' 3)   Wt 49 kg   SpO2 100%   BMI 19.13 kg/m   Physical Exam Vitals and nursing note reviewed.  Constitutional:      Appearance: She is  well-developed. She is not diaphoretic.  HENT:     Head: Normocephalic and atraumatic.     Right Ear: External ear normal.     Left Ear: External ear normal.  Eyes:     General: No scleral icterus.       Right eye: No discharge.        Left eye: No discharge.     Conjunctiva/sclera: Conjunctivae normal.  Neck:     Trachea: No tracheal deviation.  Cardiovascular:     Rate and Rhythm: Normal rate.  Pulmonary:     Effort: Pulmonary effort is normal. No respiratory distress.     Breath sounds: No stridor.  Abdominal:     General: There is no distension.  Musculoskeletal:        General: Tenderness present. No swelling or deformity.     Cervical back: Neck supple.     Comments: No shortening of the extremity, tenderness to palpation left hip, no cervical thoracic or lumbar tenderness  Skin:    General: Skin is warm and dry.     Findings: No rash.  Neurological:     Mental Status: She is alert. Mental status is at baseline.     Cranial Nerves: No dysarthria or facial asymmetry.     Motor: No seizure activity.     (all  labs ordered are listed, but only abnormal results are displayed) Labs Reviewed  CBC WITH DIFFERENTIAL/PLATELET - Abnormal; Notable for the following components:      Result Value   WBC 11.4 (*)    Neutro Abs 8.9 (*)    All other components within normal limits  BASIC METABOLIC PANEL WITH GFR  PROTIME-INR  TYPE AND SCREEN    EKG: None  Radiology: DG Hip Unilat W or Wo Pelvis 2-3 Views Left Result Date: 08/02/2024 CLINICAL DATA:  Left hip pain after a fall last night. Status post left hip replacement. EXAM: DG HIP (WITH OR WITHOUT PELVIS) 2-3V LEFT COMPARISON:  None Available. FINDINGS: Bones are diffusely demineralized. SI joints and symphysis pubis unremarkable. Sclerosis in the left ischial tuberosity is similar to an abdomen study from 06/06/2016 and 07/14/2015. Patient is status post left total hip replacement. AP and frog-leg lateral views of the left  hip show an acute periprosthetic femur fracture involving the greater trochanter. No evidence for dislocation of the femoral component. IMPRESSION: 1. Acute periprosthetic femur fracture involving the greater trochanter. 2. No evidence for dislocation of the femoral component. Electronically Signed   By: Camellia Candle M.D.   On: 08/02/2024 15:05     Procedures   Medications Ordered in the ED  morphine  (PF) 4 MG/ML injection 4 mg (has no administration in time range)    Clinical Course as of 08/02/24 1514  Sun Aug 02, 2024  1509 X-ray shows an acute periprosthetic femur fracture involving the greater trochanter [JK]    Clinical Course User Index [JK] Randol Simmonds, MD                                 Medical Decision Making Problems Addressed: Periprosthetic fracture of proximal end of femur: acute illness or injury that poses a threat to life or bodily functions  Amount and/or Complexity of Data Reviewed Labs: ordered. Decision-making details documented in ED Course. Radiology: ordered and independent interpretation performed.  Risk Prescription drug management.   Patient presented to the ED for evaluation after fall.  X-rays show a periprosthetic fracture involving the greater trochanter  Patient states she would prefer to go home if it is possible.   Will treat with pain medications, consult with ortho  Case discussed with Dr. Emmit.  He confirms patient can weight-bear as tolerated.  She can follow-up with Dr. Alucio in 2 to 3 weeks  Will make sure pt can ambulate.  Care turned over to Dr Dean     Final diagnoses:  Periprosthetic fracture of proximal end of femur    ED Discharge Orders     None          Randol Simmonds, MD 08/02/24 1528

## 2024-08-02 NOTE — ED Notes (Signed)
 Patient was able to ambulate from bed to hallway with a walker.

## 2024-08-02 NOTE — Discharge Instructions (Addendum)
 You can take over-the-counter Tylenol  for mild pain.  The percocet is for more severe pain.  Use a walker to help decrease the weight on your leg but you are allowed to bear weight as tolerated.

## 2024-08-02 NOTE — ED Provider Notes (Signed)
 Pt signed out by Dr. Randol pending ability to walk.  Pt is able to walk with a walker.  She is ready to go home.  She is stable for d/c.  Return if worse.     Dean Clarity, MD 08/02/24 1556

## 2024-08-02 NOTE — ED Triage Notes (Signed)
 Pt arrived via POV with daughter for a fall. Pt fell last night. Reports left stabbing hip pain. Pt does have history of hip replacement. Currently 10/10 and worst when walking. Pt unable to bare weight and shuffled to the car.

## 2024-08-10 DIAGNOSIS — Z96642 Presence of left artificial hip joint: Secondary | ICD-10-CM | POA: Diagnosis not present

## 2024-08-10 DIAGNOSIS — M9702XA Periprosthetic fracture around internal prosthetic left hip joint, initial encounter: Secondary | ICD-10-CM | POA: Diagnosis not present

## 2024-09-03 DIAGNOSIS — I10 Essential (primary) hypertension: Secondary | ICD-10-CM | POA: Diagnosis not present

## 2024-09-11 DIAGNOSIS — Z96642 Presence of left artificial hip joint: Secondary | ICD-10-CM | POA: Diagnosis not present

## 2024-09-11 DIAGNOSIS — M9702XA Periprosthetic fracture around internal prosthetic left hip joint, initial encounter: Secondary | ICD-10-CM | POA: Diagnosis not present

## 2024-09-15 DIAGNOSIS — M8589 Other specified disorders of bone density and structure, multiple sites: Secondary | ICD-10-CM | POA: Diagnosis not present

## 2024-10-20 ENCOUNTER — Other Ambulatory Visit: Payer: Self-pay | Admitting: Family Medicine

## 2024-10-20 DIAGNOSIS — Z1231 Encounter for screening mammogram for malignant neoplasm of breast: Secondary | ICD-10-CM

## 2024-10-23 ENCOUNTER — Ambulatory Visit
Admission: RE | Admit: 2024-10-23 | Discharge: 2024-10-23 | Disposition: A | Discharge status: Home / Self Care | Source: Ambulatory Visit | Attending: Family Medicine | Admitting: Family Medicine

## 2024-10-23 DIAGNOSIS — Z1231 Encounter for screening mammogram for malignant neoplasm of breast: Secondary | ICD-10-CM | POA: Diagnosis not present

## 2024-11-06 DIAGNOSIS — M8580 Other specified disorders of bone density and structure, unspecified site: Secondary | ICD-10-CM | POA: Diagnosis not present

## 2024-11-06 DIAGNOSIS — F325 Major depressive disorder, single episode, in full remission: Secondary | ICD-10-CM | POA: Diagnosis not present

## 2024-11-06 DIAGNOSIS — E785 Hyperlipidemia, unspecified: Secondary | ICD-10-CM | POA: Diagnosis not present

## 2024-11-06 DIAGNOSIS — K219 Gastro-esophageal reflux disease without esophagitis: Secondary | ICD-10-CM | POA: Diagnosis not present

## 2024-11-06 DIAGNOSIS — I10 Essential (primary) hypertension: Secondary | ICD-10-CM | POA: Diagnosis not present

## 2024-11-06 DIAGNOSIS — L309 Dermatitis, unspecified: Secondary | ICD-10-CM | POA: Diagnosis not present

## 2024-11-06 DIAGNOSIS — D6949 Other primary thrombocytopenia: Secondary | ICD-10-CM | POA: Diagnosis not present

## 2024-11-06 DIAGNOSIS — G4700 Insomnia, unspecified: Secondary | ICD-10-CM | POA: Diagnosis not present

## 2024-11-06 DIAGNOSIS — N3946 Mixed incontinence: Secondary | ICD-10-CM | POA: Diagnosis not present

## 2024-11-20 DIAGNOSIS — L570 Actinic keratosis: Secondary | ICD-10-CM | POA: Diagnosis not present

## 2024-11-20 DIAGNOSIS — D0439 Carcinoma in situ of skin of other parts of face: Secondary | ICD-10-CM | POA: Diagnosis not present

## 2024-11-20 DIAGNOSIS — Z85828 Personal history of other malignant neoplasm of skin: Secondary | ICD-10-CM | POA: Diagnosis not present

## 2024-12-29 ENCOUNTER — Emergency Department (HOSPITAL_COMMUNITY)

## 2024-12-29 ENCOUNTER — Encounter (HOSPITAL_COMMUNITY): Payer: Self-pay

## 2024-12-29 ENCOUNTER — Inpatient Hospital Stay (HOSPITAL_COMMUNITY)
Admission: EM | Admit: 2024-12-29 | Discharge: 2025-01-02 | DRG: 854 | Disposition: A | Discharge status: Home / Self Care | Attending: Internal Medicine | Admitting: Internal Medicine

## 2024-12-29 ENCOUNTER — Other Ambulatory Visit: Payer: Self-pay

## 2024-12-29 DIAGNOSIS — D6489 Other specified anemias: Secondary | ICD-10-CM | POA: Diagnosis not present

## 2024-12-29 DIAGNOSIS — N390 Urinary tract infection, site not specified: Secondary | ICD-10-CM | POA: Diagnosis present

## 2024-12-29 DIAGNOSIS — N179 Acute kidney failure, unspecified: Secondary | ICD-10-CM | POA: Diagnosis present

## 2024-12-29 DIAGNOSIS — R7881 Bacteremia: Secondary | ICD-10-CM | POA: Diagnosis present

## 2024-12-29 DIAGNOSIS — L8990 Pressure ulcer of unspecified site, unspecified stage: Secondary | ICD-10-CM | POA: Insufficient documentation

## 2024-12-29 DIAGNOSIS — N202 Calculus of kidney with calculus of ureter: Secondary | ICD-10-CM | POA: Diagnosis present

## 2024-12-29 DIAGNOSIS — N136 Pyonephrosis: Secondary | ICD-10-CM | POA: Diagnosis present

## 2024-12-29 DIAGNOSIS — Z91048 Other nonmedicinal substance allergy status: Secondary | ICD-10-CM

## 2024-12-29 DIAGNOSIS — Z90710 Acquired absence of both cervix and uterus: Secondary | ICD-10-CM

## 2024-12-29 DIAGNOSIS — A419 Sepsis, unspecified organism: Principal | ICD-10-CM | POA: Diagnosis present

## 2024-12-29 DIAGNOSIS — Z79899 Other long term (current) drug therapy: Secondary | ICD-10-CM

## 2024-12-29 DIAGNOSIS — G4700 Insomnia, unspecified: Secondary | ICD-10-CM | POA: Diagnosis present

## 2024-12-29 DIAGNOSIS — Z820 Family history of epilepsy and other diseases of the nervous system: Secondary | ICD-10-CM

## 2024-12-29 DIAGNOSIS — B961 Klebsiella pneumoniae [K. pneumoniae] as the cause of diseases classified elsewhere: Secondary | ICD-10-CM | POA: Diagnosis present

## 2024-12-29 DIAGNOSIS — B9620 Unspecified Escherichia coli [E. coli] as the cause of diseases classified elsewhere: Secondary | ICD-10-CM | POA: Diagnosis present

## 2024-12-29 DIAGNOSIS — Z880 Allergy status to penicillin: Secondary | ICD-10-CM

## 2024-12-29 DIAGNOSIS — K219 Gastro-esophageal reflux disease without esophagitis: Secondary | ICD-10-CM | POA: Diagnosis present

## 2024-12-29 DIAGNOSIS — E872 Acidosis, unspecified: Secondary | ICD-10-CM | POA: Diagnosis present

## 2024-12-29 DIAGNOSIS — D72829 Elevated white blood cell count, unspecified: Secondary | ICD-10-CM | POA: Diagnosis present

## 2024-12-29 DIAGNOSIS — A4151 Sepsis due to Escherichia coli [E. coli]: Principal | ICD-10-CM | POA: Diagnosis present

## 2024-12-29 DIAGNOSIS — E785 Hyperlipidemia, unspecified: Secondary | ICD-10-CM | POA: Diagnosis present

## 2024-12-29 DIAGNOSIS — Z881 Allergy status to other antibiotic agents status: Secondary | ICD-10-CM

## 2024-12-29 DIAGNOSIS — I10 Essential (primary) hypertension: Secondary | ICD-10-CM | POA: Diagnosis present

## 2024-12-29 DIAGNOSIS — R531 Weakness: Principal | ICD-10-CM

## 2024-12-29 LAB — RESP PANEL BY RT-PCR (RSV, FLU A&B, COVID)  RVPGX2
Influenza A by PCR: NEGATIVE
Influenza B by PCR: NEGATIVE
Resp Syncytial Virus by PCR: NEGATIVE
SARS Coronavirus 2 by RT PCR: NEGATIVE

## 2024-12-29 LAB — URINALYSIS, W/ REFLEX TO CULTURE (INFECTION SUSPECTED)
Bilirubin Urine: NEGATIVE
Glucose, UA: NEGATIVE mg/dL
Ketones, ur: NEGATIVE mg/dL
Nitrite: NEGATIVE
Protein, ur: 100 mg/dL — AB
Specific Gravity, Urine: 1.014 (ref 1.005–1.030)
WBC, UA: >50 WBC/hpf (ref 0–5)
pH: 5 (ref 5.0–8.0)

## 2024-12-29 LAB — COMPREHENSIVE METABOLIC PANEL WITH GFR
ALT: 13 U/L (ref 0–44)
AST: 28 U/L (ref 15–41)
Albumin: 3.9 g/dL (ref 3.5–5.0)
Alkaline Phosphatase: 108 U/L (ref 38–126)
Anion gap: 12 (ref 5–15)
BUN: 36 mg/dL — ABNORMAL HIGH (ref 8–23)
CO2: 22 mmol/L (ref 22–32)
Calcium: 9.3 mg/dL (ref 8.9–10.3)
Chloride: 99 mmol/L (ref 98–111)
Creatinine, Ser: 1.36 mg/dL — ABNORMAL HIGH (ref 0.44–1.00)
GFR, Estimated: 38 mL/min — ABNORMAL LOW
Glucose, Bld: 111 mg/dL — ABNORMAL HIGH (ref 70–99)
Potassium: 3.7 mmol/L (ref 3.5–5.1)
Sodium: 134 mmol/L — ABNORMAL LOW (ref 135–145)
Total Bilirubin: 0.7 mg/dL (ref 0.0–1.2)
Total Protein: 6.8 g/dL (ref 6.5–8.1)

## 2024-12-29 LAB — CBC WITH DIFFERENTIAL/PLATELET
Abs Immature Granulocytes: 0.66 K/uL — ABNORMAL HIGH (ref 0.00–0.07)
Basophils Absolute: 0.1 K/uL (ref 0.0–0.1)
Basophils Relative: 0 %
Eosinophils Absolute: 0 K/uL (ref 0.0–0.5)
Eosinophils Relative: 0 %
HCT: 36.2 % (ref 36.0–46.0)
Hemoglobin: 12.2 g/dL (ref 12.0–15.0)
Immature Granulocytes: 2 %
Lymphocytes Relative: 1 %
Lymphs Abs: 0.5 K/uL — ABNORMAL LOW (ref 0.7–4.0)
MCH: 30.7 pg (ref 26.0–34.0)
MCHC: 33.7 g/dL (ref 30.0–36.0)
MCV: 91.2 fL (ref 80.0–100.0)
Monocytes Absolute: 2.5 K/uL — ABNORMAL HIGH (ref 0.1–1.0)
Monocytes Relative: 6 %
Neutro Abs: 36.2 K/uL — ABNORMAL HIGH (ref 1.7–7.7)
Neutrophils Relative %: 91 %
Platelets: 247 K/uL (ref 150–400)
RBC: 3.97 MIL/uL (ref 3.87–5.11)
RDW: 13.3 % (ref 11.5–15.5)
Smear Review: NORMAL
WBC: 40 K/uL — ABNORMAL HIGH (ref 4.0–10.5)
nRBC: 0 % (ref 0.0–0.2)

## 2024-12-29 LAB — I-STAT CG4 LACTIC ACID, ED: Lactic Acid, Venous: 1.8 mmol/L (ref 0.5–1.9)

## 2024-12-29 LAB — PROTIME-INR
INR: 1.1 (ref 0.8–1.2)
Prothrombin Time: 15 s (ref 11.4–15.2)

## 2024-12-29 MED ORDER — ACETAMINOPHEN 325 MG PO TABS: 650.0000 mg | ORAL_TABLET | Freq: Four times a day (QID) | ORAL | Status: DC | PRN

## 2024-12-29 MED ORDER — SODIUM CHLORIDE 0.9 % IV BOLUS
500.0000 mL | Freq: Once | INTRAVENOUS | Status: AC
  Administered 2024-12-29: 500 mL via INTRAVENOUS

## 2024-12-29 MED ORDER — LACTATED RINGERS IV SOLN: INTRAVENOUS | Status: DC

## 2024-12-29 MED ORDER — SODIUM CHLORIDE 0.9 % IV SOLN
2.0000 g | Freq: Once | INTRAVENOUS | Status: AC
  Administered 2024-12-29: 2 g via INTRAVENOUS
  Filled 2024-12-29: qty 20

## 2024-12-29 MED ORDER — SODIUM CHLORIDE 0.9 % IV SOLN: 2.0000 g | INTRAVENOUS | Status: DC

## 2024-12-29 MED ORDER — SODIUM CHLORIDE 0.9 % IV BOLUS
1000.0000 mL | Freq: Once | INTRAVENOUS | Status: AC
  Administered 2024-12-29: 1000 mL via INTRAVENOUS

## 2024-12-29 MED ORDER — ACETAMINOPHEN 650 MG RE SUPP: 650.0000 mg | Freq: Four times a day (QID) | RECTAL | Status: DC | PRN

## 2024-12-29 MED ORDER — ENOXAPARIN SODIUM 30 MG/0.3ML IJ SOSY
30.0000 mg | PREFILLED_SYRINGE | INTRAMUSCULAR | Status: DC
  Administered 2024-12-31 – 2025-01-02 (×3): 30 mg via SUBCUTANEOUS
  Filled 2024-12-29 (×3): qty 0.3

## 2024-12-29 NOTE — H&P (Incomplete)
 " History and Physical    Annette Hernandez FMW:[id number] DOB: 10-24-1941 DOA: 12/29/2024  Patient coming from: Home.  Chief Complaint: Feeling fatigued and weak.  HPI: Annette Hernandez is a 84 y.o. female with history of hypertension, hyperlipidemia, GERD has been feeling weak and tired fatigue for the last 2 days.  Patient states she also been having some diarrhea off and on last few days.  Has not used any antibiotics recently.  Has chronic skin changes in both lower extremity but has noticed some skin changes a left forearm over the last 2 weeks.  Has been having frequent urination denies any dysuria.  ED Course: In the ER patient blood pressure was in the low normal.  WBC count was 40,000.  Lactic acid initially was 1.8.  Creatinine is 1.3 increased from 0.7 in August 2025.  UA shows more than 50 WBC with large leukocyte esterase and many bacteria.  Patient started on empiric antibiotic for sepsis source could be either UTI or diarrhea.  There is also skin changes in the left forearm which looks very superficial and nontender.  Review of Systems: As per HPI, rest all negative.   Past Medical History:  Diagnosis Date   GERD (gastroesophageal reflux disease)    Hypertension     Past Surgical History:  Procedure Laterality Date   ABDOMINAL HYSTERECTOMY     APPENDECTOMY     CYSTOSCOPY WITH INJECTION N/A 05/21/2023   Procedure: CYSTOSCOPY WITH BULKAMID INJECTION, AND BOTOX  75 UNITS;  Surgeon: Elisabeth Valli BIRCH, MD;  Location: [facility];  Service: Urology;  Laterality: N/A;  45 MINUTES   JOINT REPLACEMENT Left    X2   TONSILLECTOMY       reports that she has never smoked. She has never used smokeless tobacco. She reports that she does not drink alcohol and does not use drugs.  Allergies[1]  Family History  Problem Relation Age of Onset   Alzheimer's disease Father    Breast cancer Neg Hx     Prior to Admission medications  Medication Sig Start Date End  Date Taking? Authorizing Provider  calcium  carbonate (TUMS - DOSED IN MG ELEMENTAL CALCIUM ) 500 MG chewable tablet Chew 1 tablet by mouth daily.    [provider]  esomeprazole  (NEXIUM ) 20 MG packet Take 20 mg by mouth daily before breakfast.    [provider]  losartan  (COZAAR ) 100 MG tablet Take 50 mg by mouth daily.    [provider]  Multiple Vitamin (MULTIVITAMIN WITH MINERALS) TABS Take 1 tablet by mouth daily.    [provider]  ondansetron  (ZOFRAN ) 4 MG tablet Take 1 tablet (4 mg total) by mouth every 8 (eight) hours as needed for nausea. Patient not taking: No sig reported 04/17/13   Harris, Abigail, PA-C  oxyCODONE -acetaminophen  (PERCOCET/ROXICET) 5-325 MG tablet Take 1 tablet by mouth every 6 (six) hours as needed for severe pain (pain score 7-10). 08/02/24   Dean Clarity, MD  zolpidem  (AMBIEN ) 10 MG tablet Take 5 mg by mouth at bedtime as needed for sleep.    [provider]    Physical Exam: Constitutional: Moderately built and nourished. Vitals:   12/29/24 1820 12/29/24 1821  BP: (!) 120/56   Pulse: (!) 107   Resp: 20   Temp: 98.5 F (36.9 C)   TempSrc: Oral   SpO2: 97%   Weight:  49 kg  Height:  5' 3 (1.6 m)   Eyes: Anicteric no pallor. ENMT: No discharge from  the ears eyes nose or mouth. Neck: No mass felt.  No neck rigidity. Respiratory: No rhonchi or crepitations. Cardiovascular: S1-S2 heard. Abdomen: Soft nontender bowel sound present. Musculoskeletal: No edema. Skin: Chronic skin changes of the lower extremities.  Small erythematous area in the left forearm.  Nontender. Neurologic: Alert awake oriented time place and person.  Moves all extremities. Psychiatric: Appears normal.  Normal affect.   Labs on Admission: I have personally reviewed following labs and imaging studies  CBC: Recent Labs  Lab 12/29/24 1845  WBC 40.0*  NEUTROABS 36.2*  HGB 12.2  HCT 36.2  MCV 91.2  PLT 247   Basic Metabolic  Panel: Recent Labs  Lab 12/29/24 1845  NA 134*  K 3.7  CL 99  CO2 22  GLUCOSE 111*  BUN 36*  CREATININE 1.36*  CALCIUM  9.3   GFR: Estimated Creatinine Clearance: 23.4 mL/min (A) (by C-G formula based on SCr of 1.36 mg/dL (H)). Liver Function Tests: Recent Labs  Lab 12/29/24 1845  AST 28  ALT 13  ALKPHOS 108  BILITOT 0.7  PROT 6.8  ALBUMIN 3.9   No results for input(s): LIPASE, AMYLASE in the last 168 hours. No results for input(s): AMMONIA in the last 168 hours. Coagulation Profile: Recent Labs  Lab 12/29/24 1845  INR 1.1   Cardiac Enzymes: No results for input(s): CKTOTAL, CKMB, CKMBINDEX, TROPONINI in the last 168 hours. BNP (last 3 results) No results for input(s): PROBNP in the last 8760 hours. HbA1C: No results for input(s): HGBA1C in the last 72 hours. CBG: No results for input(s): GLUCAP in the last 168 hours. Lipid Profile: No results for input(s): CHOL, HDL, LDLCALC, TRIG, CHOLHDL, LDLDIRECT in the last 72 hours. Thyroid Function Tests: No results for input(s): TSH, T4TOTAL, FREET4, T3FREE, THYROIDAB in the last 72 hours. Anemia Panel: No results for input(s): VITAMINB12, FOLATE, FERRITIN, TIBC, IRON, RETICCTPCT in the last 72 hours. Urine analysis:    Component Value Date/Time   COLORURINE YELLOW 12/29/2024 2024   APPEARANCEUR CLOUDY (A) 12/29/2024 2024   LABSPEC 1.014 12/29/2024 2024   PHURINE 5.0 12/29/2024 2024   GLUCOSEU NEGATIVE 12/29/2024 2024   HGBUR MODERATE (A) 12/29/2024 2024   BILIRUBINUR NEGATIVE 12/29/2024 2024   KETONESUR NEGATIVE 12/29/2024 2024   PROTEINUR 100 (A) 12/29/2024 2024   UROBILINOGEN 0.2 06/29/2011 0901   NITRITE NEGATIVE 12/29/2024 2024   LEUKOCYTESUR LARGE (A) 12/29/2024 2024   Sepsis Labs: @LABRCNTIP (procalcitonin:4,lacticidven:4) ) Recent Results (from the past 240 hours)  Resp panel by RT-PCR (RSV, Flu A&B, Covid) Anterior Nasal Swab     Status: None    Collection Time: 12/29/24  6:45 PM   Specimen: Anterior Nasal Swab  Result Value Ref Range Status   SARS Coronavirus 2 by RT PCR NEGATIVE NEGATIVE Final    Comment: (NOTE) SARS-CoV-2 target nucleic acids are NOT DETECTED.  The SARS-CoV-2 RNA is generally detectable in upper respiratory specimens during the acute phase of infection. The lowest concentration of SARS-CoV-2 viral copies this assay can detect is 138 copies/mL. A negative result does not preclude SARS-Cov-2 infection and should not be used as the sole basis for treatment or other patient management decisions. A negative result may occur with  improper specimen collection/handling, submission of specimen other than nasopharyngeal swab, presence of viral mutation(s) within the areas targeted by this assay, and inadequate number of viral copies(<138 copies/mL). A negative result must be combined with clinical observations, patient history, and epidemiological information. The expected result is Negative.  Fact Sheet for Patients:  bloggercourse.com  Fact Sheet for Healthcare Providers:  seriousbroker.it  This test is no t yet approved or cleared by the [place]  FDA and  has been authorized for detection and/or diagnosis of SARS-CoV-2 by FDA under an Emergency Use Authorization (EUA). This EUA will remain  in effect (meaning this test can be used) for the duration of the COVID-19 declaration under Section 564(b)(1) of the Act, 21 U.S.C.section 360bbb-3(b)(1), unless the authorization is terminated  or revoked sooner.       Influenza A by PCR NEGATIVE NEGATIVE Final   Influenza B by PCR NEGATIVE NEGATIVE Final    Comment: (NOTE) The Xpert Xpress SARS-CoV-2/FLU/RSV plus assay is intended as an aid in the diagnosis of influenza from Nasopharyngeal swab specimens and should not be used as a sole basis for treatment. Nasal washings and aspirates are unacceptable for  Xpert Xpress SARS-CoV-2/FLU/RSV testing.  Fact Sheet for Patients: bloggercourse.com  Fact Sheet for Healthcare Providers: seriousbroker.it  This test is not yet approved or cleared by the [place]  FDA and has been authorized for detection and/or diagnosis of SARS-CoV-2 by FDA under an Emergency Use Authorization (EUA). This EUA will remain in effect (meaning this test can be used) for the duration of the COVID-19 declaration under Section 564(b)(1) of the Act, 21 U.S.C. section 360bbb-3(b)(1), unless the authorization is terminated or revoked.     Resp Syncytial Virus by PCR NEGATIVE NEGATIVE Final    Comment: (NOTE) Fact Sheet for Patients: bloggercourse.com  Fact Sheet for Healthcare Providers: seriousbroker.it  This test is not yet approved or cleared by the [place]  FDA and has been authorized for detection and/or diagnosis of SARS-CoV-2 by FDA under an Emergency Use Authorization (EUA). This EUA will remain in effect (meaning this test can be used) for the duration of the COVID-19 declaration under Section 564(b)(1) of the Act, 21 U.S.C. section 360bbb-3(b)(1), unless the authorization is terminated or revoked.  Performed at [facility], [address] [place], [place] [address]      Radiological Exams on Admission: DG Chest Port 1 View Result Date: 12/29/2024 CLINICAL DATA:  Questionable sepsis. EXAM: PORTABLE CHEST 1 VIEW COMPARISON:  Chest radiograph dated 12/16/2020. FINDINGS: No focal consolidation, pleural effusion, pneumothorax. The cardiac silhouette is within normal limits. Degenerative changes of the spine and scoliosis. No acute osseous pathology. IMPRESSION: No active disease. Electronically Signed   By: Vanetta Chou M.D.   On: 12/29/2024 18:47     Assessment/Plan Principal Problem:   Weakness Active Problems:   UTI  (urinary tract infection)   Essential hypertension   Leukocytosis   HLD (hyperlipidemia)   ARF (acute renal failure)    Sepsis -     patient's presentation with low normal blood pressure markedly elevated WBC count with diarrhea and also possible UTI concerning for sepsis.  Continue with hydration empiric antibiotics.  Source is not clear will get CT chest abdomen pelvis.  Check stool studies if there is any further diarrhea.  There are chronic skin changes of the lower extremity.  There is also some skin changes in the left forearm which appears nontender and very superficial. Acute renal failure will hold patient's ARB continue hydration follow metabolic panel.  Could be also diarrhea. Hypertension on ARB which we will hold due to low normal blood pressure and acute renal failure. Hyperlipidemia on statins. GERD on PPI.  Since patient has sepsis will need close monitoring further workup and more than 2 midnight stay.  Addendum -  I was able to reach patient's husband Mr. Annette Hernandez who said that patient has been feeling weak and difficult to ambulate last few days has had also falls and bruises to her upper extremities.  Did not lose consciousness.  DVT prophylaxis: Lovenox . Code Status: Full code. Family Communication:Patient's husband. Disposition Plan: Progressive care. Consults called: None. Admission status: Inpatient.         [1]  Allergies Allergen Reactions   Amoxicillin-Pot Clavulanate Other (See Comments)   Diphenhydramine Other (See Comments)   Other Other (See Comments)   "

## 2024-12-29 NOTE — ED Triage Notes (Signed)
 Pt came in via EMS A&O x4, c/o lethargy & weakness x 2 days, not her normal. Pt states she has had frequent urination. & wound on L.arm that appears to be infected per EMS.    BP 106/70 HR 110 R 24

## 2024-12-29 NOTE — ED Provider Notes (Signed)
 " [place] EMERGENCY DEPARTMENT AT [facility] Provider Note   CSN: [id number] Arrival date & time: 12/29/24  1811     Patient presents with: Weakness and Blood Infection   Annette Hernandez is a 84 y.o. female.  {Add pertinent medical, surgical, social history, OB history to YEP:67052} Patient has a history of hypertension.  She is complaining of dysuria and severe weakness.  Her husband had to pick her up.   Weakness      Prior to Admission medications  Medication Sig Start Date End Date Taking? Authorizing Provider  calcium  carbonate (TUMS - DOSED IN MG ELEMENTAL CALCIUM ) 500 MG chewable tablet Chew 1 tablet by mouth daily.    [provider]  esomeprazole  (NEXIUM ) 20 MG packet Take 20 mg by mouth daily before breakfast.    [provider]  losartan  (COZAAR ) 100 MG tablet Take 50 mg by mouth daily.    [provider]  Multiple Vitamin (MULTIVITAMIN WITH MINERALS) TABS Take 1 tablet by mouth daily.    [provider]  ondansetron  (ZOFRAN ) 4 MG tablet Take 1 tablet (4 mg total) by mouth every 8 (eight) hours as needed for nausea. Patient not taking: No sig reported 04/17/13   Harris, Abigail, PA-C  oxyCODONE -acetaminophen  (PERCOCET/ROXICET) 5-325 MG tablet Take 1 tablet by mouth every 6 (six) hours as needed for severe pain (pain score 7-10). 08/02/24   Dean Clarity, MD  zolpidem  (AMBIEN ) 10 MG tablet Take 5 mg by mouth at bedtime as needed for sleep.    [provider]    Allergies: Amoxicillin-pot clavulanate, Diphenhydramine, and Other    Review of Systems  Neurological:  Positive for weakness.    Updated Vital Signs BP (!) 120/56 (BP Location: Right Arm)   Pulse (!) 107   Temp 98.5 F (36.9 C) (Oral)   Resp 20   Ht 5' 3 (1.6 m)   Wt 49 kg   SpO2 97%   BMI 19.14 kg/m   Physical Exam  (all labs ordered are listed, but only abnormal results are displayed) Labs Reviewed  COMPREHENSIVE METABOLIC PANEL  WITH GFR - Abnormal; Notable for the following components:      Result Value   Sodium 134 (*)    Glucose, Bld 111 (*)    BUN 36 (*)    Creatinine, Ser 1.36 (*)    GFR, Estimated 38 (*)    All other components within normal limits  CBC WITH DIFFERENTIAL/PLATELET - Abnormal; Notable for the following components:   WBC 40.0 (*)    Neutro Abs 36.2 (*)    Lymphs Abs 0.5 (*)    Monocytes Absolute 2.5 (*)    Abs Immature Granulocytes 0.66 (*)    All other components within normal limits  URINALYSIS, W/ REFLEX TO CULTURE (INFECTION SUSPECTED) - Abnormal; Notable for the following components:   APPearance CLOUDY (*)    Hgb urine dipstick MODERATE (*)    Protein, ur 100 (*)    Leukocytes,Ua LARGE (*)    Bacteria, UA MANY (*)    Non Squamous Epithelial 0-5 (*)    All other components within normal limits  RESP PANEL BY RT-PCR (RSV, FLU A&B, COVID)  RVPGX2  CULTURE, BLOOD (ROUTINE X 2)  CULTURE, BLOOD (ROUTINE X 2)  URINE CULTURE  PROTIME-INR  I-STAT CG4 LACTIC ACID, ED  I-STAT CG4 LACTIC ACID, ED    EKG: None  Radiology: [facility] Chest Port 1 View Result Date: 12/29/2024 CLINICAL DATA:  Questionable sepsis. EXAM:  PORTABLE CHEST 1 VIEW COMPARISON:  Chest radiograph dated 12/16/2020. FINDINGS: No focal consolidation, pleural effusion, pneumothorax. The cardiac silhouette is within normal limits. Degenerative changes of the spine and scoliosis. No acute osseous pathology. IMPRESSION: No active disease. Electronically Signed   By: Vanetta Chou M.D.   On: 12/29/2024 18:47    {Document cardiac monitor, telemetry assessment procedure when appropriate:32947} Procedures   Medications Ordered in the ED  sodium chloride  0.9 % bolus 500 mL (0 mLs Intravenous Stopped 12/29/24 2042)  sodium chloride  0.9 % bolus 1,000 mL (1,000 mLs Intravenous New Bag/Given 12/29/24 2043)  cefTRIAXone  (ROCEPHIN ) 2 g in sodium chloride  0.9 % 100 mL IVPB (2 g Intravenous New Bag/Given 12/29/24 2041)     CRITICAL  CARE Performed by: Fairy Sermon Total critical care time: 40 minutes Critical care time was exclusive of separately billable procedures and treating other patients. Critical care was necessary to treat or prevent imminent or life-threatening deterioration. Critical care was time spent personally by me on the following activities: development of treatment plan with patient and/or surrogate as well as nursing, discussions with consultants, evaluation of patient's response to treatment, examination of patient, obtaining history from patient or surrogate, ordering and performing treatments and interventions, ordering and review of laboratory studies, ordering and review of radiographic studies, pulse oximetry and re-evaluation of patient's condition.  {Click here for ABCD2, HEART and other calculators REFRESH Note before signing:1}                              Medical Decision Making Amount and/or Complexity of Data Reviewed Labs: ordered. Radiology: ordered.   Patient with AKI and UTI and significant leukocytosis.  She is given fluids and started on Rocephin  and will be admitted to medicine  {Document critical care time when appropriate  Document review of labs and clinical decision tools ie CHADS2VASC2, etc  Document your independent review of radiology images and any outside records  Document your discussion with family members, caretakers and with consultants  Document social determinants of health affecting pt's care  Document your decision making why or why not admission, treatments were needed:32947:::1}   Final diagnoses:  None    ED Discharge Orders     None        "

## 2024-12-29 NOTE — ED Notes (Signed)
 Attempted to collect second set of blood cultures w/o success

## 2024-12-30 ENCOUNTER — Encounter (HOSPITAL_COMMUNITY): Payer: Self-pay | Admitting: Internal Medicine

## 2024-12-30 ENCOUNTER — Inpatient Hospital Stay (HOSPITAL_COMMUNITY): Admitting: Anesthesiology

## 2024-12-30 ENCOUNTER — Inpatient Hospital Stay (HOSPITAL_COMMUNITY)

## 2024-12-30 ENCOUNTER — Other Ambulatory Visit: Payer: Self-pay

## 2024-12-30 ENCOUNTER — Encounter (HOSPITAL_COMMUNITY)
Admission: EM | Disposition: A | Discharge status: Home / Self Care | Payer: Self-pay | Source: Home / Self Care | Attending: Internal Medicine

## 2024-12-30 DIAGNOSIS — Z880 Allergy status to penicillin: Secondary | ICD-10-CM | POA: Diagnosis not present

## 2024-12-30 DIAGNOSIS — I10 Essential (primary) hypertension: Secondary | ICD-10-CM

## 2024-12-30 DIAGNOSIS — B9689 Other specified bacterial agents as the cause of diseases classified elsewhere: Secondary | ICD-10-CM

## 2024-12-30 DIAGNOSIS — Z90710 Acquired absence of both cervix and uterus: Secondary | ICD-10-CM | POA: Diagnosis not present

## 2024-12-30 DIAGNOSIS — N201 Calculus of ureter: Secondary | ICD-10-CM | POA: Diagnosis not present

## 2024-12-30 DIAGNOSIS — Z79899 Other long term (current) drug therapy: Secondary | ICD-10-CM | POA: Diagnosis not present

## 2024-12-30 DIAGNOSIS — Z881 Allergy status to other antibiotic agents status: Secondary | ICD-10-CM | POA: Diagnosis not present

## 2024-12-30 DIAGNOSIS — D6489 Other specified anemias: Secondary | ICD-10-CM | POA: Diagnosis not present

## 2024-12-30 DIAGNOSIS — E785 Hyperlipidemia, unspecified: Secondary | ICD-10-CM | POA: Diagnosis not present

## 2024-12-30 DIAGNOSIS — N202 Calculus of kidney with calculus of ureter: Secondary | ICD-10-CM | POA: Diagnosis present

## 2024-12-30 DIAGNOSIS — Z820 Family history of epilepsy and other diseases of the nervous system: Secondary | ICD-10-CM | POA: Diagnosis not present

## 2024-12-30 DIAGNOSIS — K219 Gastro-esophageal reflux disease without esophagitis: Secondary | ICD-10-CM | POA: Diagnosis present

## 2024-12-30 DIAGNOSIS — A419 Sepsis, unspecified organism: Secondary | ICD-10-CM | POA: Diagnosis present

## 2024-12-30 DIAGNOSIS — Z91048 Other nonmedicinal substance allergy status: Secondary | ICD-10-CM | POA: Diagnosis not present

## 2024-12-30 DIAGNOSIS — N179 Acute kidney failure, unspecified: Secondary | ICD-10-CM | POA: Diagnosis present

## 2024-12-30 DIAGNOSIS — R7881 Bacteremia: Secondary | ICD-10-CM | POA: Diagnosis not present

## 2024-12-30 DIAGNOSIS — N390 Urinary tract infection, site not specified: Secondary | ICD-10-CM | POA: Diagnosis not present

## 2024-12-30 DIAGNOSIS — E872 Acidosis, unspecified: Secondary | ICD-10-CM | POA: Diagnosis present

## 2024-12-30 DIAGNOSIS — B9620 Unspecified Escherichia coli [E. coli] as the cause of diseases classified elsewhere: Secondary | ICD-10-CM | POA: Diagnosis present

## 2024-12-30 DIAGNOSIS — G4700 Insomnia, unspecified: Secondary | ICD-10-CM | POA: Diagnosis present

## 2024-12-30 DIAGNOSIS — A4151 Sepsis due to Escherichia coli [E. coli]: Secondary | ICD-10-CM | POA: Diagnosis present

## 2024-12-30 DIAGNOSIS — B961 Klebsiella pneumoniae [K. pneumoniae] as the cause of diseases classified elsewhere: Secondary | ICD-10-CM | POA: Diagnosis present

## 2024-12-30 DIAGNOSIS — N136 Pyonephrosis: Secondary | ICD-10-CM | POA: Diagnosis present

## 2024-12-30 HISTORY — PX: CYSTOSCOPY W/ URETERAL STENT PLACEMENT: SHX1429

## 2024-12-30 LAB — GASTROINTESTINAL PANEL BY PCR, STOOL (REPLACES STOOL CULTURE)

## 2024-12-30 LAB — COMPREHENSIVE METABOLIC PANEL WITH GFR
ALT: 13 U/L (ref 0–44)
AST: 27 U/L (ref 15–41)
Albumin: 3.2 g/dL — ABNORMAL LOW (ref 3.5–5.0)
Alkaline Phosphatase: 90 U/L (ref 38–126)
Anion gap: 11 (ref 5–15)
BUN: 34 mg/dL — ABNORMAL HIGH (ref 8–23)
CO2: 21 mmol/L — ABNORMAL LOW (ref 22–32)
Calcium: 8.5 mg/dL — ABNORMAL LOW (ref 8.9–10.3)
Chloride: 105 mmol/L (ref 98–111)
Creatinine, Ser: 1.25 mg/dL — ABNORMAL HIGH (ref 0.44–1.00)
GFR, Estimated: 42 mL/min — ABNORMAL LOW
Glucose, Bld: 126 mg/dL — ABNORMAL HIGH (ref 70–99)
Potassium: 3.9 mmol/L (ref 3.5–5.1)
Sodium: 137 mmol/L (ref 135–145)
Total Bilirubin: 0.3 mg/dL (ref 0.0–1.2)
Total Protein: 5.6 g/dL — ABNORMAL LOW (ref 6.5–8.1)

## 2024-12-30 LAB — CBC WITH DIFFERENTIAL/PLATELET
Abs Immature Granulocytes: 0.61 K/uL — ABNORMAL HIGH (ref 0.00–0.07)
Basophils Absolute: 0.1 K/uL (ref 0.0–0.1)
Basophils Relative: 0 %
Eosinophils Absolute: 0 K/uL (ref 0.0–0.5)
Eosinophils Relative: 0 %
HCT: 31.4 % — ABNORMAL LOW (ref 36.0–46.0)
Hemoglobin: 10.4 g/dL — ABNORMAL LOW (ref 12.0–15.0)
Immature Granulocytes: 2 %
Lymphocytes Relative: 2 %
Lymphs Abs: 0.6 K/uL — ABNORMAL LOW (ref 0.7–4.0)
MCH: 30.7 pg (ref 26.0–34.0)
MCHC: 33.1 g/dL (ref 30.0–36.0)
MCV: 92.6 fL (ref 80.0–100.0)
Monocytes Absolute: 1.6 K/uL — ABNORMAL HIGH (ref 0.1–1.0)
Monocytes Relative: 4 %
Neutro Abs: 33.8 K/uL — ABNORMAL HIGH (ref 1.7–7.7)
Neutrophils Relative %: 92 %
Platelets: 182 K/uL (ref 150–400)
RBC: 3.39 MIL/uL — ABNORMAL LOW (ref 3.87–5.11)
RDW: 13.4 % (ref 11.5–15.5)
Smear Review: NORMAL
WBC: 36.6 K/uL — ABNORMAL HIGH (ref 4.0–10.5)
nRBC: 0 % (ref 0.0–0.2)

## 2024-12-30 LAB — CBC
HCT: 32.2 % — ABNORMAL LOW (ref 36.0–46.0)
Hemoglobin: 10.8 g/dL — ABNORMAL LOW (ref 12.0–15.0)
MCH: 30.7 pg (ref 26.0–34.0)
MCHC: 33.5 g/dL (ref 30.0–36.0)
MCV: 91.5 fL (ref 80.0–100.0)
Platelets: 198 K/uL (ref 150–400)
RBC: 3.52 MIL/uL — ABNORMAL LOW (ref 3.87–5.11)
RDW: 13.4 % (ref 11.5–15.5)
WBC: 38.5 K/uL — ABNORMAL HIGH (ref 4.0–10.5)
nRBC: 0 % (ref 0.0–0.2)

## 2024-12-30 LAB — BLOOD CULTURE ID PANEL (REFLEXED) - BCID2

## 2024-12-30 LAB — LACTIC ACID, PLASMA
Lactic Acid, Venous: 2.3 mmol/L (ref 0.5–1.9)
Lactic Acid, Venous: 0.9 mmol/L (ref 0.5–1.9)

## 2024-12-30 LAB — TSH: TSH: 0.808 u[IU]/mL (ref 0.350–4.500)

## 2024-12-30 LAB — GLUCOSE, CAPILLARY: Glucose-Capillary: 136 mg/dL — ABNORMAL HIGH (ref 70–99)

## 2024-12-30 LAB — CK: Total CK: 206 U/L (ref 38–234)

## 2024-12-30 MED ORDER — VANCOMYCIN HCL 750 MG/150ML IV SOLN: 750.0000 mg | INTRAVENOUS | Status: DC

## 2024-12-30 MED ORDER — SODIUM CHLORIDE 0.9 % IV SOLN
2.0000 g | INTRAVENOUS | Status: DC
  Administered 2024-12-30 – 2025-01-01 (×3): 2 g via INTRAVENOUS
  Filled 2024-12-30 (×3): qty 20

## 2024-12-30 MED ORDER — LACTATED RINGERS IV SOLN: INTRAVENOUS | Status: DC

## 2024-12-30 MED ORDER — FENTANYL CITRATE (PF) 50 MCG/ML IJ SOSY: 25.0000 ug | PREFILLED_SYRINGE | INTRAMUSCULAR | Status: DC | PRN

## 2024-12-30 MED ORDER — ONDANSETRON HCL 4 MG/2ML IJ SOLN
INTRAMUSCULAR | Status: DC | PRN
  Administered 2024-12-30: 4 mg via INTRAVENOUS

## 2024-12-30 MED ORDER — PHENYLEPHRINE 80 MCG/ML (10ML) SYRINGE FOR IV PUSH (FOR BLOOD PRESSURE SUPPORT)
PREFILLED_SYRINGE | INTRAVENOUS | Status: AC
  Filled 2024-12-30: qty 30

## 2024-12-30 MED ORDER — LACTATED RINGERS IV BOLUS
1000.0000 mL | Freq: Once | INTRAVENOUS | Status: AC
  Administered 2024-12-30: 1000 mL via INTRAVENOUS

## 2024-12-30 MED ORDER — PROPOFOL 10 MG/ML IV BOLUS
INTRAVENOUS | Status: AC
  Filled 2024-12-30: qty 20

## 2024-12-30 MED ORDER — IOHEXOL 300 MG/ML  SOLN: INTRAMUSCULAR | Status: DC | PRN

## 2024-12-30 MED ORDER — AMISULPRIDE (ANTIEMETIC) 5 MG/2ML IV SOLN: 10.0000 mg | Freq: Once | INTRAVENOUS | Status: DC | PRN

## 2024-12-30 MED ORDER — STERILE WATER FOR IRRIGATION IR SOLN
Status: DC | PRN
  Administered 2024-12-30: 1000 mL

## 2024-12-30 MED ORDER — VANCOMYCIN HCL IN DEXTROSE 1-5 GM/200ML-% IV SOLN
1000.0000 mg | Freq: Once | INTRAVENOUS | Status: AC
  Administered 2024-12-30: 1000 mg via INTRAVENOUS
  Filled 2024-12-30: qty 200

## 2024-12-30 MED ORDER — SUGAMMADEX SODIUM 200 MG/2ML IV SOLN
INTRAVENOUS | Status: AC
  Filled 2024-12-30: qty 2

## 2024-12-30 MED ORDER — CHLORHEXIDINE GLUCONATE 0.12 % MT SOLN
15.0000 mL | Freq: Once | OROMUCOSAL | Status: AC
  Administered 2024-12-30: 15 mL via OROMUCOSAL

## 2024-12-30 MED ORDER — LIDOCAINE HCL (CARDIAC) PF 100 MG/5ML IV SOSY
PREFILLED_SYRINGE | INTRAVENOUS | Status: DC | PRN
  Administered 2024-12-30: 40 mg via INTRATRACHEAL

## 2024-12-30 MED ORDER — ZOLPIDEM TARTRATE 5 MG PO TABS
5.0000 mg | ORAL_TABLET | Freq: Every day | ORAL | Status: DC
  Administered 2024-12-30 – 2025-01-01 (×3): 5 mg via ORAL
  Filled 2024-12-30 (×3): qty 1

## 2024-12-30 MED ORDER — DEXAMETHASONE SOD PHOSPHATE PF 10 MG/ML IJ SOLN
INTRAMUSCULAR | Status: AC
  Filled 2024-12-30: qty 1

## 2024-12-30 MED ORDER — ROCURONIUM BROMIDE 10 MG/ML (PF) SYRINGE
PREFILLED_SYRINGE | INTRAVENOUS | Status: AC
  Filled 2024-12-30: qty 10

## 2024-12-30 MED ORDER — SODIUM CHLORIDE 0.9 % IV SOLN: 2.0000 g | INTRAVENOUS | Status: DC

## 2024-12-30 MED ORDER — PANTOPRAZOLE SODIUM 40 MG PO TBEC
40.0000 mg | DELAYED_RELEASE_TABLET | Freq: Every day | ORAL | Status: DC
  Administered 2024-12-31 – 2025-01-02 (×3): 40 mg via ORAL
  Filled 2024-12-30 (×3): qty 1

## 2024-12-30 MED ORDER — PHENYLEPHRINE HCL (PRESSORS) 10 MG/ML IV SOLN
INTRAVENOUS | Status: DC | PRN
  Administered 2024-12-30 (×3): 160 ug via INTRAVENOUS

## 2024-12-30 MED ORDER — PROPOFOL 10 MG/ML IV BOLUS
INTRAVENOUS | Status: DC | PRN
  Administered 2024-12-30: 100 mg via INTRAVENOUS

## 2024-12-30 MED ORDER — ONDANSETRON HCL 4 MG/2ML IJ SOLN
INTRAMUSCULAR | Status: AC
  Filled 2024-12-30: qty 2

## 2024-12-30 MED ORDER — FENTANYL CITRATE (PF) 100 MCG/2ML IJ SOLN
INTRAMUSCULAR | Status: AC
  Filled 2024-12-30: qty 2

## 2024-12-30 MED ORDER — FENTANYL CITRATE (PF) 250 MCG/5ML IJ SOLN
INTRAMUSCULAR | Status: DC | PRN
  Administered 2024-12-30: 50 ug via INTRAVENOUS
  Administered 2024-12-30: 25 ug via INTRAVENOUS

## 2024-12-30 MED ORDER — ZOLPIDEM TARTRATE 5 MG PO TABS
5.0000 mg | ORAL_TABLET | Freq: Once | ORAL | Status: AC
  Administered 2024-12-30: 5 mg via ORAL
  Filled 2024-12-30: qty 1

## 2024-12-30 MED ORDER — DEXAMETHASONE SOD PHOSPHATE PF 10 MG/ML IJ SOLN
INTRAMUSCULAR | Status: DC | PRN
  Administered 2024-12-30: 10 mg via INTRAVENOUS

## 2024-12-30 MED ORDER — ACETAMINOPHEN 10 MG/ML IV SOLN: 750.0000 mg | Freq: Once | INTRAVENOUS | Status: DC | PRN

## 2024-12-30 MED ORDER — LACTATED RINGERS IV SOLN: INTRAVENOUS | Status: AC

## 2024-12-30 MED ORDER — ONDANSETRON HCL 4 MG/2ML IJ SOLN: 4.0000 mg | Freq: Once | INTRAMUSCULAR | Status: DC | PRN

## 2024-12-30 MED ORDER — ROSUVASTATIN CALCIUM 10 MG PO TABS
10.0000 mg | ORAL_TABLET | Freq: Every day | ORAL | Status: DC
  Administered 2024-12-31 – 2025-01-02 (×3): 10 mg via ORAL
  Filled 2024-12-30 (×3): qty 1

## 2024-12-30 NOTE — ED Notes (Addendum)
 Critical lactic acid 2.3. Notified Dr. Franky. New orders placed in EPIC. Will complete and continue to monitor.

## 2024-12-30 NOTE — Op Note (Signed)
 Preoperative diagnosis:  Left ureteral stone, sepsis   Postoperative diagnosis:  Left ureteral stone, sepsis   Procedure:  Cystoscopy Left ureteral stent placement (6 x 24 - no string)   Surgeon: Gretel CANDIE Ferrara, Mickey. M.D.  Anesthesia: General  Complications: None  EBL: Minimal  Specimens: None  Indication: Annette Hernandez is a 84 y.o. patient with a left ureteral calculus and sepsis. After reviewing the management options for treatment, he elected to proceed with the above surgical procedure(s). We have discussed the potential benefits and risks of the procedure, side effects of the proposed treatment, the likelihood of the patient achieving the goals of the procedure, and any potential problems that might occur during the procedure or recuperation. Informed consent has been obtained.  Description of procedure:  The patient was taken to the operating room and general anesthesia was induced.  The patient was placed in the dorsal lithotomy position, prepped and draped in the usual sterile fashion, and preoperative antibiotics were administered. A preoperative time-out was performed.   Cystourethroscopy was performed.  The patients urethra was examined and was normal. The bladder was then systematically examined in its entirety. There was no evidence for any bladder tumors, stones, or other mucosal pathology.    Attention then turned to the left ureteral orifice and a ureteral catheter was used to intubate the ureteral orifice.   A 0.38 sensor guidewire was then advanced up the left ureter into the renal pelvis under fluoroscopic guidance.  The wire was then backloaded through the cystoscope and a ureteral stent was advance over the wire using Seldinger technique.  The stent was positioned appropriately under fluoroscopic and cystoscopic guidance.  The wire was then removed with an adequate stent curl noted in the renal pelvis as well as in the bladder.  The bladder was then  emptied and the procedure ended.  The patient appeared to tolerate the procedure well and without complications.  The patient was able to be awakened and transferred to the recovery unit in satisfactory condition.    Gretel CANDIE Ferrara Teddie MD

## 2024-12-30 NOTE — Transfer of Care (Signed)
 Immediate Anesthesia Transfer of Care Note  Patient: Annette Hernandez  Procedure(s) Performed: CYSTOSCOPY, WITH RETROGRADE PYELOGRAM AND URETERAL STENT INSERTION (Left: Urethra)  Patient Location: PACU  Anesthesia Type:General  Level of Consciousness: drowsy and patient cooperative  Airway & Oxygen Therapy: Patient Spontanous Breathing and Patient connected to face mask oxygen  Post-op Assessment: Report given to RN and Post -op Vital signs reviewed and stable  Post vital signs: Reviewed and stable  Last Vitals:  Vitals Value Taken Time  BP 122/52 12/30/24 12:27  Temp 37.8 C 12/30/24 12:27  Pulse 93 12/30/24 12:29  Resp 12 12/30/24 12:29  SpO2 100 % 12/30/24 12:29  Vitals shown include unfiled device data.  Last Pain:  Vitals:   12/30/24 0200  TempSrc: Oral  PainSc: 0-No pain         Complications: No notable events documented.

## 2024-12-30 NOTE — Progress Notes (Signed)
 PHARMACY - PHYSICIAN COMMUNICATION CRITICAL VALUE ALERT - BLOOD CULTURE IDENTIFICATION (BCID)  Annette Hernandez is an 84 y.o. female who presented to [facility] on 12/29/2024 with a chief complaint of weakness, fatigue, intermittent diarrhea; found to be septic. Noted that patient has a history of pseudomonas aeruginosa bacteremia back in 2022.  Assessment:  1/2 Bcx bottles growing GNRs, BCID showing kleb pneumo (no resistance detected)   Name of physician (or Provider) Contacted: Dr. Austria  Current antibiotics: cefepime  Changes to prescribed antibiotics recommended:  Consider de-escalating to ceftriaxone  monotherapy given no resistance detected on BCID  Results for orders placed or performed during the hospital encounter of 12/29/24  Blood Culture ID Panel (Reflexed) (Collected: 12/29/2024  6:45 PM)  Result Value Ref Range   Enterococcus faecalis NOT DETECTED NOT DETECTED   Enterococcus Faecium NOT DETECTED NOT DETECTED   Listeria monocytogenes NOT DETECTED NOT DETECTED   Staphylococcus species NOT DETECTED NOT DETECTED   Staphylococcus aureus (BCID) NOT DETECTED NOT DETECTED   Staphylococcus epidermidis NOT DETECTED NOT DETECTED   Staphylococcus lugdunensis NOT DETECTED NOT DETECTED   Streptococcus species NOT DETECTED NOT DETECTED   Streptococcus agalactiae NOT DETECTED NOT DETECTED   Streptococcus pneumoniae NOT DETECTED NOT DETECTED   Streptococcus pyogenes NOT DETECTED NOT DETECTED   A.calcoaceticus-baumannii NOT DETECTED NOT DETECTED   Bacteroides fragilis NOT DETECTED NOT DETECTED   Enterobacterales DETECTED (A) NOT DETECTED   Enterobacter cloacae complex NOT DETECTED NOT DETECTED   Escherichia coli NOT DETECTED NOT DETECTED   Klebsiella aerogenes NOT DETECTED NOT DETECTED   Klebsiella oxytoca NOT DETECTED NOT DETECTED   Klebsiella pneumoniae DETECTED (A) NOT DETECTED   Proteus species NOT DETECTED NOT DETECTED   Salmonella species NOT DETECTED NOT DETECTED    Serratia marcescens NOT DETECTED NOT DETECTED   Haemophilus influenzae NOT DETECTED NOT DETECTED   Neisseria meningitidis NOT DETECTED NOT DETECTED   Pseudomonas aeruginosa NOT DETECTED NOT DETECTED   Stenotrophomonas maltophilia NOT DETECTED NOT DETECTED   Candida albicans NOT DETECTED NOT DETECTED   Candida auris NOT DETECTED NOT DETECTED   Candida glabrata NOT DETECTED NOT DETECTED   Candida krusei NOT DETECTED NOT DETECTED   Candida parapsilosis NOT DETECTED NOT DETECTED   Candida tropicalis NOT DETECTED NOT DETECTED   Cryptococcus neoformans/gattii NOT DETECTED NOT DETECTED   CTX-M ESBL NOT DETECTED NOT DETECTED   Carbapenem resistance IMP NOT DETECTED NOT DETECTED   Carbapenem resistance KPC NOT DETECTED NOT DETECTED   Carbapenem resistance NDM NOT DETECTED NOT DETECTED   Carbapenem resist OXA 48 LIKE NOT DETECTED NOT DETECTED   Carbapenem resistance VIM NOT DETECTED NOT DETECTED    Lacinda Moats, PharmD Clinical Pharmacist  1/14/202610:48 AM

## 2024-12-30 NOTE — Consult Note (Signed)
 "  Urology Consult Note   Requesting Attending Physician:  [place], Eric J, DO Service Providing Consult: Urology  Consulting Attending: Dr. Renda   Reason for Consult: Obstructing left ureteral stone with sepsis  HPI: Annette Hernandez is seen in consultation for reasons noted above at the request of [place], Camellia PARAS, DO. Patient is a 84 y.o. female presenting to [facility] emergency department with primary concern of progressive weakness and fatigue x 2d.  Patient denies previous urologic history.  CT A/P notes mid 8 mm left ureteral stone, leukocytosis to 40,000, lactic acidosis, AKI, and UTI.  On my arrival patient was resting comfortably and looked better than her labs would indicate thankfully.  We reviewed the case and plan including the natural history of stone development and available options to her with infographic's.  All questions were answered to their satisfaction. ------------------  Assessment:   84 y.o. female with obstructing left mid ureteral stone with developing septic picture   Recommendations: # Left ureteral stone # AKI # Sepsis, urinary  Urinalysis suggestive of infection, lactic acidosis, and significant leukocytosis consistent with pyelonephritis/septic picture. Agree with broad ABX.  Cultures pending. To the OR with Dr. Renda for ureteral stent placement.  Case and plan discussed with Dr. Renda  Past Medical History: Past Medical History:  Diagnosis Date   GERD (gastroesophageal reflux disease)    Hypertension     Past Surgical History:  Past Surgical History:  Procedure Laterality Date   ABDOMINAL HYSTERECTOMY     APPENDECTOMY     CYSTOSCOPY WITH INJECTION N/A 05/21/2023   Procedure: CYSTOSCOPY WITH BULKAMID INJECTION, AND BOTOX  75 UNITS;  Surgeon: Elisabeth Valli BIRCH, MD;  Location: [facility];  Service: Urology;  Laterality: N/A;  45 MINUTES   JOINT REPLACEMENT Left    X2   TONSILLECTOMY      Medication: Current  Facility-Administered Medications  Medication Dose Route Frequency Provider Last Rate Last Admin   acetaminophen  (TYLENOL ) tablet 650 mg  650 mg Oral Q6H PRN Franky Redia SAILOR, MD       Or   acetaminophen  (TYLENOL ) suppository 650 mg  650 mg Rectal Q6H PRN Kakrakandy, Arshad N, MD       ceFEPIme (MAXIPIME) 2 g in sodium chloride  0.9 % 100 mL IVPB  2 g Intravenous Q24H Nicholaus Quarry, [facility]       enoxaparin  (LOVENOX ) injection 30 mg  30 mg Subcutaneous Q24H Franky Redia SAILOR, MD       lactated ringers  infusion   Intravenous Continuous Franky Redia SAILOR, MD 125 mL/hr at 12/30/24 9372 Rate Change at 12/30/24 9372   pantoprazole  (PROTONIX ) EC tablet 40 mg  40 mg Oral Daily Franky Redia SAILOR, MD       rosuvastatin  (CRESTOR ) tablet 10 mg  10 mg Oral Daily Franky Redia SAILOR, MD       Current Outpatient Medications  Medication Sig Dispense Refill   ADVIL 200 MG CAPS Take 200 mg by mouth every 6 (six) hours as needed (for mild pain).     calcium  carbonate (TUMS - DOSED IN MG ELEMENTAL CALCIUM ) 500 MG chewable tablet Chew 1 tablet by mouth daily as needed for indigestion or heartburn.     losartan  (COZAAR ) 100 MG tablet Take 50 mg by mouth daily.     Multiple Vitamin (MULTIVITAMIN WITH MINERALS) TABS Take 1 tablet by mouth daily with breakfast.     NEXIUM  24HR 20 MG capsule Take 20 mg by mouth daily before breakfast.  rosuvastatin  (CRESTOR ) 10 MG tablet Take 10 mg by mouth daily.     zolpidem  (AMBIEN ) 10 MG tablet Take 5 mg by mouth at bedtime.     ondansetron  (ZOFRAN ) 4 MG tablet Take 1 tablet (4 mg total) by mouth every 8 (eight) hours as needed for nausea. (Patient not taking: Reported on 12/16/2020) 20 tablet 0   oxyCODONE -acetaminophen  (PERCOCET/ROXICET) 5-325 MG tablet Take 1 tablet by mouth every 6 (six) hours as needed for severe pain (pain score 7-10). (Patient not taking: Reported on 12/29/2024) 12 tablet 0    Allergies: Allergies[1]  Social History: Social  History[2]  Family History Family History  Problem Relation Age of Onset   Alzheimer's disease Father    Breast cancer Neg Hx     Review of Systems  Genitourinary:  Positive for flank pain. Negative for dysuria, frequency, hematuria and urgency.     Objective   Vital signs in last 24 hours: BP 107/66   Pulse (!) 108   Temp 99.1 F (37.3 C) (Oral)   Resp 20   Ht 5' 3 (1.6 m)   Wt 49 kg   SpO2 94%   BMI 19.14 kg/m   Physical Exam General: A&O, resting, appropriate HEENT: [place]/AT Pulmonary: Normal work of breathing Cardiovascular: no cyanosis    Most Recent Labs: Lab Results  Component Value Date   WBC 36.6 (H) 12/30/2024   HGB 10.4 (L) 12/30/2024   HCT 31.4 (L) 12/30/2024   PLT 182 12/30/2024    Lab Results  Component Value Date   NA 137 12/30/2024   K 3.9 12/30/2024   CL 105 12/30/2024   CO2 21 (L) 12/30/2024   BUN 34 (H) 12/30/2024   CREATININE 1.25 (H) 12/30/2024   CALCIUM  8.5 (L) 12/30/2024   MG 2.0 07/07/2011    Lab Results  Component Value Date   INR 1.1 12/29/2024   APTT 29 10/02/2007     Urine Culture: @[id number] (laburin,org,[id number],[id number])@   IMAGING: CT HEAD WO CONTRAST ( ) Result Date: 12/30/2024 EXAM: CT HEAD WITHOUT CONTRAST 12/30/2024 07:15:00 AM TECHNIQUE: CT of the head was performed without the administration of intravenous contrast. Automated exposure control, iterative reconstruction, and/or weight based adjustment of the mA/kV was utilized to reduce the radiation dose to as low as reasonably achievable. COMPARISON: 02/14/2021. CLINICAL HISTORY: Mental status change, unknown cause. The patient presents with a mental status change of unknown cause. FINDINGS: BRAIN AND VENTRICLES: No acute hemorrhage. No evidence of acute infarct. No hydrocephalus. No extra-axial collection. No mass effect or midline shift. ORBITS: No acute abnormality. SINUSES: No acute abnormality. SOFT TISSUES AND SKULL: No acute soft tissue abnormality. No skull  fracture. IMPRESSION: 1. No acute intracranial abnormality. Electronically signed by: Waddell Calk MD 12/30/2024 07:33 AM EST RP Workstation: [id number]   CT CHEST ABDOMEN PELVIS WO CONTRAST Result Date: 12/30/2024 EXAM: CT CHEST, ABDOMEN AND PELVIS WITHOUT CONTRAST 12/30/2024 06:18:23 AM TECHNIQUE: CT of the chest, abdomen and pelvis was performed without the administration of intravenous contrast. Multiplanar reformatted images are provided for review. Automated exposure control, iterative reconstruction, and/or weight based adjustment of the mA/kV was utilized to reduce the radiation dose to as low as reasonably achievable. COMPARISON: CT of the abdomen and pelvis dated 06/22/2015. CLINICAL HISTORY: Sepsis. FINDINGS: CHEST: MEDIASTINUM AND LYMPH NODES: Heart demonstrates mild-to-moderate calcific coronary artery disease. Pericardium is unremarkable. The central airways are clear. No mediastinal, hilar or axillary lymphadenopathy. LUNGS AND PLEURA: Ground-glass and streaky opacities present independently within the lower lobes bilaterally. The nondependent lungs  are clear. Tiny bilateral pleural effusions. No pneumothorax. ABDOMEN AND PELVIS: LIVER: Unremarkable. GALLBLADDER AND BILE DUCTS: Numerous calcified stones lying dependently within the gallbladder. No definite evidence of cholecystitis. No biliary ductal dilatation. SPLEEN: No acute abnormality. PANCREAS: No acute abnormality. ADRENAL GLANDS: No acute abnormality. KIDNEYS, URETERS AND BLADDER: Left kidney: Ovoid calculus obstructing the left mid ureter measuring approximately 8 x 4 x 6 mm. Moderate-to-severe left hydronephrosis and perinephric fatty stranding. Nonobstructive calculus in the lower pole of the left kidney, measuring approximately 9 mm in diameter. Right kidney: Couple of calculi present within the lower pole of the right kidney, with the larger calculus measuring 3 mm in diameter. No evidence of obstructive uropathy on the right.  Urinary bladder is unremarkable. GI AND BOWEL: Stomach demonstrates no acute abnormality. There is no bowel obstruction. REPRODUCTIVE ORGANS: Status post hysterectomy and bilateral salpingo-oophorectomy. PERITONEUM AND RETROPERITONEUM: No ascites. No free air. VASCULATURE: Abdominal aorta is normal in caliber and demonstrates moderate calcific atheromatous disease. Thoracic aorta demonstrates mild calcific atheromatous disease. ABDOMINAL AND PELVIS LYMPH NODES: No lymphadenopathy. BONES AND SOFT TISSUES: Moderate dextroscoliosis of the thoracolumbar spine. Moderate diffuse chronic degenerative disc disease present throughout it. Status post left total hip arthroplasty. No focal soft tissue abnormality. IMPRESSION: 1. Ovoid calculus obstructing the left mid ureter measuring approximately 8 x 4 x 6 mm, with moderate-to-severe left hydronephrosis and perinephric fatty stranding. 2. Nonobstructive calculus in the lower pole of the left kidney, measuring approximately 9 mm in diameter. 3. Couple of calculi in the lower pole of the right kidney, with the larger calculus measuring 3 mm in diameter. No evidence of obstructive uropathy on the right. 4. Numerous calcified stones lying dependently within the gallbladder. No definite evidence of cholecystitis. 5. Ground-glass and streaky opacities in the lower lobes bilaterally with tiny bilateral pleural effusions. 6. Mild-to-moderate calcific coronary artery disease. Electronically signed by: Evalene Coho MD 12/30/2024 06:40 AM EST RP Workstation: [id number]   DG Chest Port 1 View Result Date: 12/29/2024 CLINICAL DATA:  Questionable sepsis. EXAM: PORTABLE CHEST 1 VIEW COMPARISON:  Chest radiograph dated 12/16/2020. FINDINGS: No focal consolidation, pleural effusion, pneumothorax. The cardiac silhouette is within normal limits. Degenerative changes of the spine and scoliosis. No acute osseous pathology. IMPRESSION: No active disease. Electronically Signed   By: Vanetta Chou M.D.   On: 12/29/2024 18:47    ------  Ole Bourdon, NP Pager: [phone]   Please contact the urology consult pager with any further questions/concerns.     [1]  Allergies Allergen Reactions   Tape Other (See Comments)    SKIN TEARS AND BRUISES EASILY!!!!   Amoxicillin-Pot Clavulanate Other (See Comments)    Reaction not recalled   Ciprofloxacin  Other (See Comments)    Reaction not recalled   Diphenhydramine Other (See Comments)    Reaction not recalled   Escitalopram Other (See Comments)    Reaction not recalled   Nitrofurantoin Other (See Comments)    Reaction not recalled  [2]  Social History Tobacco Use   Smoking status: Never   Smokeless tobacco: Never  Substance Use Topics   Alcohol use: No   Drug use: No   "

## 2024-12-30 NOTE — Consult Note (Addendum)
 WOC Nurse Consult Note: Consult requested for left arm wound.  Pt is currently in the perioperative setting; consult performed remotely after review of progress notes and photos in the EMR.  Left posterior arm near the elbow with red moist full thickness wound; appearance and location is consistent with an abrasion.    Topical treatment orders performed as follows to absorb drainage and provide antimicrobial benefits: Cut piece of Aquacel Soila # [phone]) and apply to left arm wound Q day, then cover with foam dressing.  Change foam dressing Q 3 days or PRN.  Moisten previous Aquacel with NS each time to assist with removal.   Please re-consult if further assistance is needed.  Thank-you,  Stephane Fought MSN, RN, CWOCN, CWCN-AP, CNS Contact Mon-Fri 0700-1500: [phone]

## 2024-12-30 NOTE — Progress Notes (Signed)
 " PROGRESS NOTE    Annette Hernandez  FMW:[id number] DOB: 1941-06-09 DOA: 12/29/2024 PCP: Cleotilde Planas, MD    Brief Narrative:   Annette Hernandez is a 84 y.o. female with past medical history significant for HTN, HLD, GERD who presented to [facility] ED on 12/30/2023 from home via EMS with complaints of weakness, lethargy over the last 2 days.  Reports frequent urination.  Additionally patient with left forearm wound after fall over the last 2 weeks that is poorly healed.  In the ED, temperature 98.5 F, HR 107, RR 27, BP 120/56, SpO2 97% on room air.  WBC 40.0, hemoglobin 12.2, platelet count 247.  Sodium 134, potassium 3.7, chloride 99, CO2 22, glucose 111, BUN 36, creatinine 1.36.  AST 28, ALT 13, total bilirubin 0.7.  Lactic acid 1.8.  COVID/influenza/RSV PCR negative.  Urinalysis with large leukocytes, negative nitrite, many bacteria, greater than 50 WBCs.  Blood cultures x 2 and urine culture obtained.  CT head without contrast with no acute intracranial abnormality.  CT chest/abdomen/pelvis without contrast with ovoid calculus obstructing the left mid ureter measuring 8 x 4 x 6 mm with moderate to severe left hydronephrosis and perinephric fatty stranding, nonobstructive calculus lower pole left kidney measuring approximately 9 mm in diameter, ground glass and streaky opacities lower lobes bilaterally with tiny bilateral pleural effusions, mild/moderate calcific coronary artery disease.  Patient started empiric antibiotics.  TRH consulted for admission for further evaluation management of sepsis likely secondary to UTI/pyelonephritis.  Assessment & Plan:   Sepsis, POA Left pyelonephritis Klebsiella bacteremia Severe left hydronephrosis secondary to obstructive nephrolithiasis Patient presenting with weakness, lethargy over the past 2 days.  Patient was afebrile, tachycardic, tachypneic with elevated WBC count of 40.0 at time of admission.  Urinalysis consistent with UTI.  CT  head without contrast with no acute abnormality.  CT chest/abdomen/pelvis with findings of severe left hydronephrosis with obstructive calculus measuring 8 x 4 x 6 mm with perinephric stranding on the left consistent with pyelonephritis.  Patient was started empiric antibiotics, urology consulted and patient underwent cystoscopy with left ureteral stent placement by Dr. Renda on 12/30/2024. -- WBC 40.0>38.5>36.6 -- Urine Culture: pending -- Blood Culture x 2 : + GNRs, BCID + Klebsiella pneumoniae, pending susceptibilities -- Ceftriaxone  2 g IV every 24 hours -- CBC in the a.m. -- Will need close follow-up with urology outpatient for definitive stone treatment  Acute renal failure Baseline creatinine 0.72.  Creatinine elevated on admission 1.36, likely secondary to sepsis/bacteremia, hydronephrosis from obstructive nephrolithiasis as above. -- Cr 1.36>1.25 -- S/p cystoscopy with stent placement 1/14 -- LR at 75 mL/h -- Holding home losartan  -- Repeat BMP in a.m.  Left forearm wound Seen by wound RN. Cut piece of Aquacel and apply to left arm wound Q day, then cover with foam dressing. Change foam dressing Q 3 days or PRN. Moisten previous Aquacel with NS each time to assist with removal.   HTN -- Holding home losartan  for now -- Repeat BMP in a.m.  HLD -- Crestor  10 mg p.o. daily  GERD -- Protonix  40 mg PO daily  Insomnia -- Ambien  5 mg p.o. nightly   DVT prophylaxis: enoxaparin  (LOVENOX ) injection 30 mg Start: 12/30/24 1000    Code Status: Full Code Family Communication: Updated spouse present at bedside  Disposition Plan:  Level of care: Progressive Status is: Inpatient Remains inpatient appropriate because: IV antibiotics, awaiting culture sensitivities    Consultants:  Urology, Dr. Rudolpho  Procedures:  Cystoscopy  with left ureteral stent placement, 1/14; Dr. Renda  Antimicrobials:  Vancomycin  1/13 - 1/13 Ceftriaxone  1/13>>   Subjective: Patient seen  examined bedside, lying in bed.  Discussed CT findings with patient and spouse.  Consulted urology and underwent cystoscopy with left ureteral stent placement today.  Remains on IV antibiotics, blood cultures growing Klebsiella and awaiting susceptibilities.  No other specific questions, concerns or complaints at this time.  Denies headache, no dizziness, no chest pain, no palpitations, no shortness of breath, no current abdominal pain, no fever/chills/night sweats, no nausea/vomiting/diarrhea.  No acute events overnight per nursing staff.  Objective: Vitals:   12/30/24 1300 12/30/24 1400 12/30/24 1456 12/30/24 1924  BP: (!) 98/54 (!) 95/51 125/66 112/64  Pulse: 88 84 77 74  Resp: 14 16 14 16   Temp:  98.7 F (37.1 C) 98.6 F (37 C) 97.6 F (36.4 C)  TempSrc:   Oral Oral  SpO2: 100% 93% 99% 98%  Weight:      Height:        Intake/Output Summary (Last 24 hours) at 12/30/2024 1928 Last data filed at 12/30/2024 1700 Gross per 24 hour  Intake 2896.18 ml  Output --  Net 2896.18 ml   Filed Weights   12/29/24 1821  Weight: 49 kg    Examination:  Physical Exam: GEN: NAD, alert and oriented x 3, elderly appearance HEENT: NCAT, PERRL, EOMI, sclera clear, MMM PULM: CTAB w/o wheezes/crackles, normal respiratory effort, room air CV: RRR w/o M/G/R GI: abd soft, NTND, bowel sounds present MSK: no peripheral edema, moves all extremities independently with preserved muscle strength NEURO: No focal neurological deficit PSYCH: normal mood/affect Integumentary: Left forearm/elbow wound as depicted below, otherwise no concerning rashes/lesions/wounds nonexposed skin surfaces       Data Reviewed: I have personally reviewed following labs and imaging studies  CBC: Recent Labs  Lab 12/29/24 1845 12/30/24 0316 12/30/24 0440  WBC 40.0* 38.5* 36.6*  NEUTROABS 36.2*  --  33.8*  HGB 12.2 10.8* 10.4*  HCT 36.2 32.2* 31.4*  MCV 91.2 91.5 92.6  PLT 247 198 182   Basic Metabolic  Panel: Recent Labs  Lab 12/29/24 1845 12/30/24 0440  NA 134* 137  K 3.7 3.9  CL 99 105  CO2 22 21*  GLUCOSE 111* 126*  BUN 36* 34*  CREATININE 1.36* 1.25*  CALCIUM  9.3 8.5*   GFR: Estimated Creatinine Clearance: 25.5 mL/min (A) (by C-G formula based on SCr of 1.25 mg/dL (H)). Liver Function Tests: Recent Labs  Lab 12/29/24 1845 12/30/24 0440  AST 28 27  ALT 13 13  ALKPHOS 108 90  BILITOT 0.7 0.3  PROT 6.8 5.6*  ALBUMIN 3.9 3.2*   No results for input(s): LIPASE, AMYLASE in the last 168 hours. No results for input(s): AMMONIA in the last 168 hours. Coagulation Profile: Recent Labs  Lab 12/29/24 1845  INR 1.1   Cardiac Enzymes: Recent Labs  Lab 12/30/24 0440  CKTOTAL 206   BNP (last 3 results) No results for input(s): PROBNP in the last 8760 hours. HbA1C: No results for input(s): HGBA1C in the last 72 hours. CBG: No results for input(s): GLUCAP in the last 168 hours. Lipid Profile: No results for input(s): CHOL, HDL, LDLCALC, TRIG, CHOLHDL, LDLDIRECT in the last 72 hours. Thyroid Function Tests: Recent Labs    12/30/24 0440  TSH 0.808   Anemia Panel: No results for input(s): VITAMINB12, FOLATE, FERRITIN, TIBC, IRON, RETICCTPCT in the last 72 hours. Sepsis Labs: Recent Labs  Lab 12/29/24 1854 12/30/24 0440  12/30/24 1349  LATICACIDVEN 1.8 2.3* 0.9    Recent Results (from the past 240 hours)  Blood Culture (routine x 2)     Status: None (Preliminary result)   Collection Time: 12/29/24  6:45 PM   Specimen: BLOOD  Result Value Ref Range Status   Specimen Description   Final    BLOOD RIGHT ANTECUBITAL Performed at [facility], [address] [place], [place] [address]    Special Requests   Final    BOTTLES DRAWN AEROBIC AND ANAEROBIC Blood Culture adequate volume Performed at [facility], [address], [place], [place] [address]    Culture  Setup Time   Final    GRAM  NEGATIVE RODS AEROBIC BOTTLE ONLY CRITICAL RESULT CALLED TO, READ BACK BY AND VERIFIED WITH: PHARMD MARY  S [id number] FCP Performed at [facility], [address], [place], [place] [address]    Culture GRAM NEGATIVE RODS  Final   Report Status PENDING  Incomplete  Resp panel by RT-PCR (RSV, Flu A&B, Covid) Anterior Nasal Swab     Status: None   Collection Time: 12/29/24  6:45 PM   Specimen: Anterior Nasal Swab  Result Value Ref Range Status   SARS Coronavirus 2 by RT PCR NEGATIVE NEGATIVE Final    Comment: (NOTE) SARS-CoV-2 target nucleic acids are NOT DETECTED.  The SARS-CoV-2 RNA is generally detectable in upper respiratory specimens during the acute phase of infection. The lowest concentration of SARS-CoV-2 viral copies this assay can detect is 138 copies/mL. A negative result does not preclude SARS-Cov-2 infection and should not be used as the sole basis for treatment or other patient management decisions. A negative result may occur with  improper specimen collection/handling, submission of specimen other than nasopharyngeal swab, presence of viral mutation(s) within the areas targeted by this assay, and inadequate number of viral copies(<138 copies/mL). A negative result must be combined with clinical observations, patient history, and epidemiological information. The expected result is Negative.  Fact Sheet for Patients:  bloggercourse.com  Fact Sheet for Healthcare Providers:  seriousbroker.it  This test is no t yet approved or cleared by the [place]  FDA and  has been authorized for detection and/or diagnosis of SARS-CoV-2 by FDA under an Emergency Use Authorization (EUA). This EUA will remain  in effect (meaning this test can be used) for the duration of the COVID-19 declaration under Section 564(b)(1) of the Act, 21 U.S.C.section 360bbb-3(b)(1), unless the authorization is terminated  or revoked sooner.        Influenza A by PCR NEGATIVE NEGATIVE Final   Influenza B by PCR NEGATIVE NEGATIVE Final    Comment: (NOTE) The Xpert Xpress SARS-CoV-2/FLU/RSV plus assay is intended as an aid in the diagnosis of influenza from Nasopharyngeal swab specimens and should not be used as a sole basis for treatment. Nasal washings and aspirates are unacceptable for Xpert Xpress SARS-CoV-2/FLU/RSV testing.  Fact Sheet for Patients: bloggercourse.com  Fact Sheet for Healthcare Providers: seriousbroker.it  This test is not yet approved or cleared by the [place]  FDA and has been authorized for detection and/or diagnosis of SARS-CoV-2 by FDA under an Emergency Use Authorization (EUA). This EUA will remain in effect (meaning this test can be used) for the duration of the COVID-19 declaration under Section 564(b)(1) of the Act, 21 U.S.C. section 360bbb-3(b)(1), unless the authorization is terminated or revoked.     Resp Syncytial Virus by PCR NEGATIVE NEGATIVE Final    Comment: (NOTE) Fact  Sheet for Patients: bloggercourse.com  Fact Sheet for Healthcare Providers: seriousbroker.it  This test is not yet approved or cleared by the [place]  FDA and has been authorized for detection and/or diagnosis of SARS-CoV-2 by FDA under an Emergency Use Authorization (EUA). This EUA will remain in effect (meaning this test can be used) for the duration of the COVID-19 declaration under Section 564(b)(1) of the Act, 21 U.S.C. section 360bbb-3(b)(1), unless the authorization is terminated or revoked.  Performed at [facility], [address], [place], [place] [address]   Blood Culture ID Panel (Reflexed)     Status: Abnormal   Collection Time: 12/29/24  6:45 PM  Result Value Ref Range Status   Enterococcus faecalis NOT DETECTED NOT DETECTED Final   Enterococcus Faecium NOT  DETECTED NOT DETECTED Final   Listeria monocytogenes NOT DETECTED NOT DETECTED Final   Staphylococcus species NOT DETECTED NOT DETECTED Final   Staphylococcus aureus (BCID) NOT DETECTED NOT DETECTED Final   Staphylococcus epidermidis NOT DETECTED NOT DETECTED Final   Staphylococcus lugdunensis NOT DETECTED NOT DETECTED Final   Streptococcus species NOT DETECTED NOT DETECTED Final   Streptococcus agalactiae NOT DETECTED NOT DETECTED Final   Streptococcus pneumoniae NOT DETECTED NOT DETECTED Final   Streptococcus pyogenes NOT DETECTED NOT DETECTED Final   A.calcoaceticus-baumannii NOT DETECTED NOT DETECTED Final   Bacteroides fragilis NOT DETECTED NOT DETECTED Final   Enterobacterales DETECTED (A) NOT DETECTED Final    Comment: Enterobacterales represent a large order of gram negative bacteria, not a single organism. CRITICAL RESULT CALLED TO, READ BACK BY AND VERIFIED WITH: PHARMD MARY  S [id number] FCP    Enterobacter cloacae complex NOT DETECTED NOT DETECTED Final   Escherichia coli NOT DETECTED NOT DETECTED Final   Klebsiella aerogenes NOT DETECTED NOT DETECTED Final   Klebsiella oxytoca NOT DETECTED NOT DETECTED Final   Klebsiella pneumoniae DETECTED (A) NOT DETECTED Final    Comment: CRITICAL RESULT CALLED TO, READ BACK BY AND VERIFIED WITH: PHARMD MARY  S [id number] FCP    Proteus species NOT DETECTED NOT DETECTED Final   Salmonella species NOT DETECTED NOT DETECTED Final   Serratia marcescens NOT DETECTED NOT DETECTED Final   Haemophilus influenzae NOT DETECTED NOT DETECTED Final   Neisseria meningitidis NOT DETECTED NOT DETECTED Final   Pseudomonas aeruginosa NOT DETECTED NOT DETECTED Final   Stenotrophomonas maltophilia NOT DETECTED NOT DETECTED Final   Candida albicans NOT DETECTED NOT DETECTED Final   Candida auris NOT DETECTED NOT DETECTED Final   Candida glabrata NOT DETECTED NOT DETECTED Final   Candida krusei NOT DETECTED NOT DETECTED Final   Candida parapsilosis NOT  DETECTED NOT DETECTED Final   Candida tropicalis NOT DETECTED NOT DETECTED Final   Cryptococcus neoformans/gattii NOT DETECTED NOT DETECTED Final   CTX-M ESBL NOT DETECTED NOT DETECTED Final   Carbapenem resistance IMP NOT DETECTED NOT DETECTED Final   Carbapenem resistance KPC NOT DETECTED NOT DETECTED Final   Carbapenem resistance NDM NOT DETECTED NOT DETECTED Final   Carbapenem resist OXA 48 LIKE NOT DETECTED NOT DETECTED Final   Carbapenem resistance VIM NOT DETECTED NOT DETECTED Final    Comment: Performed at [facility], [address], [place], [place] [address]  Gastrointestinal Panel by PCR , Stool     Status: None   Collection Time: 12/30/24  3:16 AM   Specimen: STOOL  Result Value Ref Range Status   Campylobacter species NOT DETECTED NOT DETECTED Final   Plesimonas shigelloides NOT DETECTED  NOT DETECTED Final   Salmonella species NOT DETECTED NOT DETECTED Final   Yersinia enterocolitica NOT DETECTED NOT DETECTED Final   Vibrio species NOT DETECTED NOT DETECTED Final   Vibrio cholerae NOT DETECTED NOT DETECTED Final   Enteroaggregative E coli (EAEC) NOT DETECTED NOT DETECTED Final   Enteropathogenic E coli (EPEC) NOT DETECTED NOT DETECTED Final   Enterotoxigenic E coli (ETEC) NOT DETECTED NOT DETECTED Final   Shiga like toxin producing E coli (STEC) NOT DETECTED NOT DETECTED Final   Shigella/Enteroinvasive E coli (EIEC) NOT DETECTED NOT DETECTED Final   Cryptosporidium NOT DETECTED NOT DETECTED Final   Cyclospora cayetanensis NOT DETECTED NOT DETECTED Final   Entamoeba histolytica NOT DETECTED NOT DETECTED Final   Giardia lamblia NOT DETECTED NOT DETECTED Final   Adenovirus F40/41 NOT DETECTED NOT DETECTED Final   Astrovirus NOT DETECTED NOT DETECTED Final   Norovirus GI/GII NOT DETECTED NOT DETECTED Final   Rotavirus A NOT DETECTED NOT DETECTED Final   Sapovirus (I, II, IV, and V) NOT DETECTED NOT DETECTED Final    Comment: Performed at [facility], [address] [place], [place] [address]  Blood Culture (routine x 2)     Status: None (Preliminary result)   Collection Time: 12/30/24  1:49 PM   Specimen: BLOOD RIGHT HAND  Result Value Ref Range Status   Specimen Description   Final    BLOOD RIGHT HAND Performed at [facility], [address], [place], [place] [address]    Special Requests   Final    BOTTLES DRAWN AEROBIC AND ANAEROBIC Blood Culture adequate volume Performed at [facility], [address], [place], [place] [address]    Culture PENDING  Incomplete   Report Status PENDING  Incomplete         Radiology Studies: DG C-Arm 1-60 Min-No Report Result Date: 12/30/2024 Fluoroscopy was utilized by the requesting physician.  No radiographic interpretation.   CT HEAD WO CONTRAST ( ) Result Date: 12/30/2024 EXAM: CT HEAD WITHOUT CONTRAST 12/30/2024 07:15:00 AM TECHNIQUE: CT of the head was performed without the administration of intravenous contrast. Automated exposure control, iterative reconstruction, and/or weight based adjustment of the mA/kV was utilized to reduce the radiation dose to as low as reasonably achievable. COMPARISON: 02/14/2021. CLINICAL HISTORY: Mental status change, unknown cause. The patient presents with a mental status change of unknown cause. FINDINGS: BRAIN AND VENTRICLES: No acute hemorrhage. No evidence of acute infarct. No hydrocephalus. No extra-axial collection. No mass effect or midline shift. ORBITS: No acute abnormality. SINUSES: No acute abnormality. SOFT TISSUES AND SKULL: No acute soft tissue abnormality. No skull fracture. IMPRESSION: 1. No acute intracranial abnormality. Electronically signed by: Waddell Calk MD 12/30/2024 07:33 AM EST RP Workstation: [id number]   CT CHEST ABDOMEN PELVIS WO CONTRAST Result Date: 12/30/2024 EXAM: CT CHEST, ABDOMEN AND PELVIS WITHOUT CONTRAST 12/30/2024 06:18:23 AM TECHNIQUE: CT of the chest, abdomen and pelvis was performed without  the administration of intravenous contrast. Multiplanar reformatted images are provided for review. Automated exposure control, iterative reconstruction, and/or weight based adjustment of the mA/kV was utilized to reduce the radiation dose to as low as reasonably achievable. COMPARISON: CT of the abdomen and pelvis dated 06/22/2015. CLINICAL HISTORY: Sepsis. FINDINGS: CHEST: MEDIASTINUM AND LYMPH NODES: Heart demonstrates mild-to-moderate calcific coronary artery disease. Pericardium is unremarkable. The central airways are clear. No mediastinal, hilar or axillary lymphadenopathy. LUNGS AND PLEURA: Ground-glass and streaky opacities present independently within the lower lobes bilaterally. The nondependent lungs are clear.  Tiny bilateral pleural effusions. No pneumothorax. ABDOMEN AND PELVIS: LIVER: Unremarkable. GALLBLADDER AND BILE DUCTS: Numerous calcified stones lying dependently within the gallbladder. No definite evidence of cholecystitis. No biliary ductal dilatation. SPLEEN: No acute abnormality. PANCREAS: No acute abnormality. ADRENAL GLANDS: No acute abnormality. KIDNEYS, URETERS AND BLADDER: Left kidney: Ovoid calculus obstructing the left mid ureter measuring approximately 8 x 4 x 6 mm. Moderate-to-severe left hydronephrosis and perinephric fatty stranding. Nonobstructive calculus in the lower pole of the left kidney, measuring approximately 9 mm in diameter. Right kidney: Couple of calculi present within the lower pole of the right kidney, with the larger calculus measuring 3 mm in diameter. No evidence of obstructive uropathy on the right. Urinary bladder is unremarkable. GI AND BOWEL: Stomach demonstrates no acute abnormality. There is no bowel obstruction. REPRODUCTIVE ORGANS: Status post hysterectomy and bilateral salpingo-oophorectomy. PERITONEUM AND RETROPERITONEUM: No ascites. No free air. VASCULATURE: Abdominal aorta is normal in caliber and demonstrates moderate calcific atheromatous disease.  Thoracic aorta demonstrates mild calcific atheromatous disease. ABDOMINAL AND PELVIS LYMPH NODES: No lymphadenopathy. BONES AND SOFT TISSUES: Moderate dextroscoliosis of the thoracolumbar spine. Moderate diffuse chronic degenerative disc disease present throughout it. Status post left total hip arthroplasty. No focal soft tissue abnormality. IMPRESSION: 1. Ovoid calculus obstructing the left mid ureter measuring approximately 8 x 4 x 6 mm, with moderate-to-severe left hydronephrosis and perinephric fatty stranding. 2. Nonobstructive calculus in the lower pole of the left kidney, measuring approximately 9 mm in diameter. 3. Couple of calculi in the lower pole of the right kidney, with the larger calculus measuring 3 mm in diameter. No evidence of obstructive uropathy on the right. 4. Numerous calcified stones lying dependently within the gallbladder. No definite evidence of cholecystitis. 5. Ground-glass and streaky opacities in the lower lobes bilaterally with tiny bilateral pleural effusions. 6. Mild-to-moderate calcific coronary artery disease. Electronically signed by: Evalene Coho MD 12/30/2024 06:40 AM EST RP Workstation: [id number]   DG Chest Port 1 View Result Date: 12/29/2024 CLINICAL DATA:  Questionable sepsis. EXAM: PORTABLE CHEST 1 VIEW COMPARISON:  Chest radiograph dated 12/16/2020. FINDINGS: No focal consolidation, pleural effusion, pneumothorax. The cardiac silhouette is within normal limits. Degenerative changes of the spine and scoliosis. No acute osseous pathology. IMPRESSION: No active disease. Electronically Signed   By: Vanetta Chou M.D.   On: 12/29/2024 18:47        Scheduled Meds:  enoxaparin  (LOVENOX ) injection  30 mg Subcutaneous Q24H   pantoprazole   40 mg Oral Daily   rosuvastatin   10 mg Oral Daily   zolpidem   5 mg Oral QHS   Continuous Infusions:  cefTRIAXone  (ROCEPHIN )  IV     lactated ringers  125 mL/hr at 12/30/24 1609     LOS: 0 days    Time spent: 51  minutes spent on 12/30/2024 caring for this patient face-to-face including chart review, ordering labs/tests, documenting, discussion with nursing staff, consultants, updating family and interview/physical exam    Camellia PARAS Constance Whittle, DO [facility] Available via Epic secure chat 7am-7pm After these hours, please refer to coverage provider listed on [email or web address] 12/30/2024, 7:28 PM   "

## 2024-12-30 NOTE — Anesthesia Preprocedure Evaluation (Addendum)
"                                    Anesthesia Evaluation  Patient identified by MRN, date of birth, ID band Patient awake    Reviewed: Allergy & Precautions, NPO status , Patient's Chart, lab work & pertinent test results  Airway Mallampati: III  TM Distance: >3 FB Neck ROM: Full    Dental no notable dental hx.    Pulmonary neg pulmonary ROS   Pulmonary exam normal        Cardiovascular hypertension, Pt. on medications Normal cardiovascular exam     Neuro/Psych  PSYCHIATRIC DISORDERS  Depression    negative neurological ROS     GI/Hepatic Neg liver ROS,GERD  Medicated and Controlled,,  Endo/Other  negative endocrine ROS    Renal/GU Renal disease     Musculoskeletal negative musculoskeletal ROS (+)    Abdominal   Peds  Hematology  (+) Blood dyscrasia, anemia   Anesthesia Other Findings LEFT OBSTRUCTING URETERAL STONE  Reproductive/Obstetrics                              Anesthesia Physical Anesthesia Plan  ASA: 3  Anesthesia Plan: General   Post-op Pain Management:    Induction: Intravenous  PONV Risk Score and Plan: 3 and Ondansetron , Dexamethasone  and Treatment may vary due to age or medical condition  Airway Management Planned: LMA  Additional Equipment:   Intra-op Plan:   Post-operative Plan: Extubation in OR  Informed Consent: I have reviewed the patients History and Physical, chart, labs and discussed the procedure including the risks, benefits and alternatives for the proposed anesthesia with the patient or authorized representative who has indicated his/her understanding and acceptance.     Dental advisory given  Plan Discussed with: CRNA  Anesthesia Plan Comments:          Anesthesia Quick Evaluation  "

## 2024-12-30 NOTE — Anesthesia Procedure Notes (Signed)
 Procedure Name: LMA Insertion Date/Time: 12/30/2024 11:55 AM  Performed by: Nanci Riis, CRNAPre-anesthesia Checklist: Patient identified, Emergency Drugs available, Suction available, Patient being monitored and Timeout performed Patient Re-evaluated:Patient Re-evaluated prior to induction Oxygen Delivery Method: Circle system utilized Preoxygenation: Pre-oxygenation with 100% oxygen Induction Type: IV induction LMA: LMA flexible inserted LMA Size: 3.0 Number of attempts: 1 Placement Confirmation: positive ETCO2 and breath sounds checked- equal and bilateral Tube secured with: Tape Dental Injury: Teeth and Oropharynx as per pre-operative assessment

## 2024-12-30 NOTE — Progress Notes (Signed)
 Pharmacy Antibiotic Note  Annette Hernandez is a 84 y.o. female admitted on 12/29/2024 with sepsis.  Pharmacy has been consulted for Vancomycin  dosing.  Scr acutely elevated elevated (baseline ~0.8)  Plan: Vancomycin  1gm IV x1 then 750mg  IV q48h for estimated AUC 420 (goal 400-550) Monitor renal function and cx data   Height: 5' 3 (160 cm) Weight: 49 kg (108 lb 0.4 oz) IBW/kg (Calculated) : 52.4  Temp (24hrs), Avg:98.8 F (37.1 C), Min:98.5 F (36.9 C), Max:99.1 F (37.3 C)  Recent Labs  Lab 12/29/24 1845 12/29/24 1854 12/30/24 0316 12/30/24 0440  WBC 40.0*  --  38.5* 36.6*  CREATININE 1.36*  --   --  1.25*  LATICACIDVEN  --  1.8  --  2.3*    Estimated Creatinine Clearance: 25.5 mL/min (A) (by C-G formula based on SCr of 1.25 mg/dL (H)).    Allergies[1]  Antimicrobials this admission: 1/13 Rocephin  >>  1/14 Vancomycin  >>   Dose adjustments this admission:  Microbiology results: 1/13 BCx:  1/13 UCx:  1/13 Resp PCR: negative 1/14 GI PCR:  Thank you for allowing pharmacy to be a part of this patients care.  Rosaline Millet PharmD 12/30/2024 5:43 AM     [1]  Allergies Allergen Reactions   Tape Other (See Comments)    SKIN TEARS AND BRUISES EASILY!!!!   Amoxicillin-Pot Clavulanate Other (See Comments)    Reaction not recalled   Ciprofloxacin  Other (See Comments)    Reaction not recalled   Diphenhydramine Other (See Comments)    Reaction not recalled   Escitalopram Other (See Comments)    Reaction not recalled   Nitrofurantoin Other (See Comments)    Reaction not recalled

## 2024-12-30 NOTE — Anesthesia Postprocedure Evaluation (Signed)
"   Anesthesia Post Note  Patient: Annette Hernandez  Procedure(s) Performed: CYSTOSCOPY, WITH RETROGRADE PYELOGRAM AND URETERAL STENT INSERTION (Left: Urethra)     Patient location during evaluation: PACU Anesthesia Type: General Level of consciousness: awake Pain management: pain level controlled Vital Signs Assessment: post-procedure vital signs reviewed and stable Respiratory status: spontaneous breathing, nonlabored ventilation and respiratory function stable Cardiovascular status: blood pressure returned to baseline and stable Postop Assessment: no apparent nausea or vomiting Anesthetic complications: no   No notable events documented.  Last Vitals:  Vitals:   12/30/24 1400 12/30/24 1456  BP: (!) 95/51 125/66  Pulse: 84 77  Resp: 16 14  Temp: 37.1 C 37 C  SpO2: 93% 99%    Last Pain:  Vitals:   12/30/24 1519  TempSrc:   PainSc: 0-No pain                 Coleton Woon P Duc Crocket      "

## 2024-12-30 NOTE — Consult Note (Addendum)
 WOC Nurse Consult Note:  WOC consult performed remotely utilizing imaging and chart review  Reason for Consult: Left upper arm wound  Wound type: grade 3 skin tear to L upper arm Pressure Injury POA: NA- not pressure  Measurement: see nursing flow sheets Wound bed: 100 % pink, moist Drainage (amount, consistency, odor) see nursing flow sheets Periwound: intact, ecchymosis Dressing procedure/placement/frequency:  Cleanse wound with NS, allow to air dry.  Cover wound bed with Aquacel AG (lawson # [id number]) and top with silicone foam dressing.  Change daily.    WOC team will not follow patient at this time, please re consult if new needs arise.  Thank you,  Doyal Polite, MSN, RN, [facility] WOC Team [phone] (Available Mon-Fri 0700-1500)

## 2024-12-31 ENCOUNTER — Encounter (HOSPITAL_COMMUNITY): Payer: Self-pay | Admitting: Urology

## 2024-12-31 DIAGNOSIS — N390 Urinary tract infection, site not specified: Secondary | ICD-10-CM | POA: Diagnosis not present

## 2024-12-31 DIAGNOSIS — B9620 Unspecified Escherichia coli [E. coli] as the cause of diseases classified elsewhere: Secondary | ICD-10-CM | POA: Diagnosis not present

## 2024-12-31 DIAGNOSIS — R7881 Bacteremia: Secondary | ICD-10-CM | POA: Diagnosis not present

## 2024-12-31 DIAGNOSIS — B961 Klebsiella pneumoniae [K. pneumoniae] as the cause of diseases classified elsewhere: Secondary | ICD-10-CM | POA: Diagnosis not present

## 2024-12-31 LAB — GLUCOSE, CAPILLARY
Glucose-Capillary: 125 mg/dL — ABNORMAL HIGH (ref 70–99)
Glucose-Capillary: 117 mg/dL — ABNORMAL HIGH (ref 70–99)

## 2024-12-31 LAB — BASIC METABOLIC PANEL WITH GFR
Anion gap: 8 (ref 5–15)
BUN: 34 mg/dL — ABNORMAL HIGH (ref 8–23)
CO2: 20 mmol/L — ABNORMAL LOW (ref 22–32)
Calcium: 8.4 mg/dL — ABNORMAL LOW (ref 8.9–10.3)
Chloride: 107 mmol/L (ref 98–111)
Creatinine, Ser: 0.97 mg/dL (ref 0.44–1.00)
GFR, Estimated: 57 mL/min — ABNORMAL LOW
Glucose, Bld: 127 mg/dL — ABNORMAL HIGH (ref 70–99)
Potassium: 4.2 mmol/L (ref 3.5–5.1)
Sodium: 135 mmol/L (ref 135–145)

## 2024-12-31 LAB — CBC
HCT: 27.8 % — ABNORMAL LOW (ref 36.0–46.0)
Hemoglobin: 9.3 g/dL — ABNORMAL LOW (ref 12.0–15.0)
MCH: 30.5 pg (ref 26.0–34.0)
MCHC: 33.5 g/dL (ref 30.0–36.0)
MCV: 91.1 fL (ref 80.0–100.0)
Platelets: 156 K/uL (ref 150–400)
RBC: 3.05 MIL/uL — ABNORMAL LOW (ref 3.87–5.11)
RDW: 13.5 % (ref 11.5–15.5)
WBC: 23.9 K/uL — ABNORMAL HIGH (ref 4.0–10.5)
nRBC: 0 % (ref 0.0–0.2)

## 2024-12-31 MED ORDER — CARMEX CLASSIC LIP BALM EX OINT
TOPICAL_OINTMENT | CUTANEOUS | Status: DC | PRN
  Filled 2024-12-31: qty 10

## 2024-12-31 NOTE — Evaluation (Signed)
 Occupational Therapy Evaluation/Discharge Patient Details Name: Annette Hernandez MRN: [id number] DOB: [birth year] Today's Date: 12/31/2024   History of Present Illness   84 y.o. female with history of hypertension, hyperlipidemia, GERD has been feeling weak and tired fatigue for the last 2 days. 1/14: cystoscopy and stent placed     Clinical Impressions Patient evaluated by Occupational Therapy with no further acute OT needs identified.  Pt is functioning at or close to her baseline. Lives at home with her spouse and is normally independent with ADL's and functional mobility at baseline. All education has been completed and the patient has no further questions. No follow-up Occupational Therapy or equipment needs. OT is signing off. Thank you for this referral.      If plan is discharge home, recommend the following:   Assist for transportation;Help with stairs or ramp for entrance     Functional Status Assessment   Patient has not had a recent decline in their functional status     Equipment Recommendations   None recommended by OT      Precautions/Restrictions   Precautions Precautions: Fall Recall of Precautions/Restrictions: Intact Restrictions Weight Bearing Restrictions Per Provider Order: No     Mobility Bed Mobility Overal bed mobility: Modified Independent   Transfers Overall transfer level: Needs assistance Equipment used: Rolling walker (2 wheels) Transfers: Sit to/from Stand, Bed to chair/wheelchair/BSC Sit to Stand: Supervision     Step pivot transfers: Supervision     General transfer comment: SBA provided for IV pole management. RW provided for reassurance although pt did not rely on external support.      Balance Overall balance assessment: History of Falls, Modified Independent       ADL either performed or assessed with clinical judgement   ADL Overall ADL's : Modified independent;At baseline        Vision Baseline  Vision/History: (P) 1 Wears glasses Ability to See in Adequate Light: (P) 0 Adequate Patient Visual Report: (P) No change from baseline Vision Assessment?: (P) Wears glasses for reading     Perception Perception: (P) Not tested       Praxis Praxis: (P) Not tested       Pertinent Vitals/Pain Pain Assessment Pain Assessment: No/denies pain     Extremity/Trunk Assessment Upper Extremity Assessment Upper Extremity Assessment: Right hand dominant;Overall [facility] for tasks assessed   Lower Extremity Assessment Lower Extremity Assessment: Overall WFL for tasks assessed   Cervical / Trunk Assessment Cervical / Trunk Assessment: Normal   Communication Communication Communication: No apparent difficulties   Cognition Arousal: Alert Behavior During Therapy: WFL for tasks assessed/performed Cognition: No apparent impairments           Following commands: Intact       Cueing  General Comments   Cueing Techniques: Verbal cues  VSS on RA           Home Living Family/patient expects to be discharged to:: Private residence Living Arrangements: Spouse/significant other Available Help at Discharge: Family;Available 24 hours/day Type of Home: House Home Access: Stairs to enter [employer] of Steps: 4 Entrance Stairs-Rails: Right Home Layout: Bed/bath upstairs;Able to live on main level with bedroom/bathroom     Bathroom Shower/Tub: Tub only;Walk-in shower   Bathroom Toilet: Handicapped height Bathroom Accessibility: Yes How Accessible: Accessible via walker Home Equipment: Rolling Walker (2 wheels);Cane - single point;Shower seat - built in          Prior Functioning/Environment Prior Level of Function : Independent/Modified Independent;History of Falls (last six months);Driving  Mobility Comments: Does not use any AD at baseline      OT Problem List: Impaired balance (sitting and/or standing)        OT Goals(Current goals can be  found in the care plan section)   Acute Rehab OT Goals OT Goal Formulation: All assessment and education complete, DC therapy   OT Frequency:   1X time       AM-PAC OT 6 Clicks Daily Activity     Outcome Measure Help from another person eating meals?: None Help from another person taking care of personal grooming?: None Help from another person toileting, which includes using toliet, bedpan, or urinal?: None Help from another person bathing (including washing, rinsing, drying)?: None Help from another person to put on and taking off regular upper body clothing?: None Help from another person to put on and taking off regular lower body clothing?: None 6 Click Score: 24   End of Session Equipment Utilized During Treatment: Rolling walker (2 wheels) Nurse Communication: Mobility status  Activity Tolerance: Patient tolerated treatment well Patient left: in chair;with call bell/phone within reach;with chair alarm set;with family/visitor present  OT Visit Diagnosis: Muscle weakness (generalized) (M62.81);History of falling (Z91.81)                Time: 9040-8975 OT Time Calculation (min): 25 min Charges:  OT General Charges $OT Visit: 1 Visit OT Evaluation $OT Eval Low Complexity: 1 Low  Leita Howell, OTR/L,CBIS  Supplemental OT - [facility] and [facility] Secure Chat Preferred    Kinsley Holderman, Leita BIRCH 12/31/2024, 10:27 AM

## 2024-12-31 NOTE — Progress Notes (Signed)
 Patient ID: Masaye Gatchalian, female   DOB: 1941-11-18, 84 y.o.   MRN: [id number]  1 Day Post-Op Subjective: Feeling much improved.  Denies flank pain.  Objective: Vital signs in last 24 hours: Temp:  [97.4 F (36.3 C)-100 F (37.8 C)] 98.3 F (36.8 C) (01/15 0507) Pulse Rate:  [65-94] 67 (01/15 0507) Resp:  [12-18] 18 (01/15 0507) BP: (95-139)/(50-72) 139/72 (01/15 0507) SpO2:  [93 %-100 %] 98 % (01/15 0507)  Intake/Output from previous day: 01/14 0701 - 01/15 0700 In: 3386.8 [P.O.:120; I.V.:1969.4; IV Piggyback:1297.3] Out: -  Intake/Output this shift: No intake/output data recorded.  Physical Exam:  General: Alert and oriented GU: No CVAT  Lab Results: Recent Labs    12/30/24 0316 12/30/24 0440 12/31/24 0424  HGB 10.8* 10.4* 9.3*  HCT 32.2* 31.4* 27.8*      Latest Ref Rng & Units 12/31/2024    4:24 AM 12/30/2024    4:40 AM 12/30/2024    3:16 AM  CBC  WBC 4.0 - 10.5 K/uL 23.9  36.6  38.5   Hemoglobin 12.0 - 15.0 g/dL 9.3  89.5  89.1   Hematocrit 36.0 - 46.0 % 27.8  31.4  32.2   Platelets 150 - 400 K/uL 156  182  198      BMET Recent Labs    12/30/24 0440 12/31/24 0424  NA 137 135  K 3.9 4.2  CL 105 107  CO2 21* 20*  GLUCOSE 126* 127*  BUN 34* 34*  CREATININE 1.25* 0.97  CALCIUM  8.5* 8.4*     Studies/Results: Urine culture: GNRs  Assessment/Plan: 1) Left ureteral and renal calculi: S/P left ureteral stent placement 1/14.  Final sensitivities pending.  Tailor antibiotic therapy as appropriate.  Will arrange outpatient follow up.  Will discuss options for definitive management for stone disease prior to discharge.   LOS: 1 day   [name] 12/31/2024, 10:04 AM

## 2024-12-31 NOTE — Progress Notes (Signed)
 " PROGRESS NOTE    Annette Hernandez  FMW:[id number] DOB: 08-Apr-1941 DOA: 12/29/2024 PCP: Cleotilde Planas, MD    Brief Narrative:   Annette Hernandez is a 84 y.o. female with past medical history significant for HTN, HLD, GERD who presented to [facility] on 12/30/2023 from home via EMS with complaints of weakness, lethargy over the last 2 days.  Reports frequent urination.  Additionally patient with left forearm wound after fall over the last 2 weeks that is poorly healed.  In the ED, temperature 98.5 F, HR 107, RR 27, BP 120/56, SpO2 97% on room air.  WBC 40.0, hemoglobin 12.2, platelet count 247.  Sodium 134, potassium 3.7, chloride 99, CO2 22, glucose 111, BUN 36, creatinine 1.36.  AST 28, ALT 13, total bilirubin 0.7.  Lactic acid 1.8.  COVID/influenza/RSV PCR negative.  Urinalysis with large leukocytes, negative nitrite, many bacteria, greater than 50 WBCs.  Blood cultures x 2 and urine culture obtained.  CT head without contrast with no acute intracranial abnormality.  CT chest/abdomen/pelvis without contrast with ovoid calculus obstructing the left mid ureter measuring 8 x 4 x 6 mm with moderate to severe left hydronephrosis and perinephric fatty stranding, nonobstructive calculus lower pole left kidney measuring approximately 9 mm in diameter, ground glass and streaky opacities lower lobes bilaterally with tiny bilateral pleural effusions, mild/moderate calcific coronary artery disease.  Patient started empiric antibiotics.  TRH consulted for admission for further evaluation management of sepsis likely secondary to UTI/pyelonephritis.  Assessment & Plan:   Sepsis, POA Left pyelonephritis Klebsiella bacteremia Severe left hydronephrosis secondary to obstructive nephrolithiasis Patient presenting with weakness, lethargy over the past 2 days.  Patient was afebrile, tachycardic, tachypneic with elevated WBC count of 40.0 at time of admission.  Urinalysis consistent with UTI.  CT  head without contrast with no acute abnormality.  CT chest/abdomen/pelvis with findings of severe left hydronephrosis with obstructive calculus measuring 8 x 4 x 6 mm with perinephric stranding on the left consistent with pyelonephritis.  Patient was started empiric antibiotics, urology consulted and patient underwent cystoscopy with left ureteral stent placement by Dr. Renda on 12/30/2024. -- WBC 40.0>38.5>36.6>23.9 -- Urine Culture: > 100K Klebsiella pneumoniae, pending susceptibilities -- Blood Culture x 2 : + Klebsiella pneumoniae, pending susceptibilities -- Ceftriaxone  2 g IV every 24 hours -- CBC in the a.m. -- Will need close follow-up with urology outpatient for definitive stone treatment  Acute renal failure Baseline creatinine 0.72.  Creatinine elevated on admission 1.36, likely secondary to sepsis/bacteremia, hydronephrosis from obstructive nephrolithiasis as above. -- Cr 1.36>1.25>0.97 -- S/p cystoscopy with stent placement 1/14 -- Holding home losartan ; likely resume on discharge -- Repeat BMP in a.m.  Left forearm wound Seen by wound RN. Cut piece of Aquacel and apply to left arm wound Q day, then cover with foam dressing. Change foam dressing Q 3 days or PRN. Moisten previous Aquacel with NS each time to assist with removal.   HTN -- Holding home losartan  for now, likely to resume on discharge -- Repeat BMP in a.m.  HLD -- Crestor  10 mg p.o. daily  GERD -- Protonix  40 mg PO daily  Insomnia -- Ambien  5 mg p.o. nightly   DVT prophylaxis: enoxaparin  (LOVENOX ) injection 30 mg Start: 12/30/24 1000    Code Status: Full Code Family Communication: Updated spouse present at bedside  Disposition Plan:  Level of care: Med-Surg Status is: Inpatient Remains inpatient appropriate because: IV antibiotics, awaiting culture sensitivities    Consultants:  Urology,  Dr. Rudolpho  Procedures:  Cystoscopy with left ureteral stent placement, 1/14; Dr. Renda  Antimicrobials:   Vancomycin  1/13 - 1/13 Ceftriaxone  1/13>>   Subjective: Patient seen examined bedside, sitting on edge of bed.  RN present at bedside.  No complaints.  Feels well.  Awaiting culture sensitivities in order to transition to oral antibiotics.  Seen by urology with plan outpatient follow-up for definitive stone treatment. No other specific questions, concerns or complaints at this time.  Denies headache, no dizziness, no chest pain, no palpitations, no shortness of breath, no current abdominal pain, no fever/chills/night sweats, no nausea/vomiting/diarrhea.  No acute events overnight per nursing staff.  Objective: Vitals:   12/30/24 1456 12/30/24 1924 12/30/24 2321 12/31/24 0507  BP: 125/66 112/64 136/64 139/72  Pulse: 77 74 65 67  Resp: 14 16 16 18   Temp: 98.6 F (37 C) 97.6 F (36.4 C) (!) 97.4 F (36.3 C) 98.3 F (36.8 C)  TempSrc: Oral Oral  Oral  SpO2: 99% 98% 95% 98%  Weight:      Height:        Intake/Output Summary (Last 24 hours) at 12/31/2024 1359 Last data filed at 12/31/2024 0200 Gross per 24 hour  Intake 1439.44 ml  Output --  Net 1439.44 ml   Filed Weights   12/29/24 1821  Weight: 49 kg    Examination:  Physical Exam: GEN: NAD, alert and oriented x 3, elderly appearance HEENT: NCAT, PERRL, EOMI, sclera clear, MMM PULM: CTAB w/o wheezes/crackles, normal respiratory effort, room air CV: RRR w/o M/G/R GI: abd soft, NTND, bowel sounds present MSK: no peripheral edema, moves all extremities independently with preserved muscle strength NEURO: No focal neurological deficit PSYCH: normal mood/affect Integumentary: Left forearm/elbow wound as depicted below, otherwise no concerning rashes/lesions/wounds nonexposed skin surfaces       Data Reviewed: I have personally reviewed following labs and imaging studies  CBC: Recent Labs  Lab 12/29/24 1845 12/30/24 0316 12/30/24 0440 12/31/24 0424  WBC 40.0* 38.5* 36.6* 23.9*  NEUTROABS 36.2*  --  33.8*  --    HGB 12.2 10.8* 10.4* 9.3*  HCT 36.2 32.2* 31.4* 27.8*  MCV 91.2 91.5 92.6 91.1  PLT 247 198 182 156   Basic Metabolic Panel: Recent Labs  Lab 12/29/24 1845 12/30/24 0440 12/31/24 0424  NA 134* 137 135  K 3.7 3.9 4.2  CL 99 105 107  CO2 22 21* 20*  GLUCOSE 111* 126* 127*  BUN 36* 34* 34*  CREATININE 1.36* 1.25* 0.97  CALCIUM  9.3 8.5* 8.4*   GFR: Estimated Creatinine Clearance: 32.8 mL/min (by C-G formula based on SCr of 0.97 mg/dL). Liver Function Tests: Recent Labs  Lab 12/29/24 1845 12/30/24 0440  AST 28 27  ALT 13 13  ALKPHOS 108 90  BILITOT 0.7 0.3  PROT 6.8 5.6*  ALBUMIN 3.9 3.2*   No results for input(s): LIPASE, AMYLASE in the last 168 hours. No results for input(s): AMMONIA in the last 168 hours. Coagulation Profile: Recent Labs  Lab 12/29/24 1845  INR 1.1   Cardiac Enzymes: Recent Labs  Lab 12/30/24 0440  CKTOTAL 206   BNP (last 3 results) No results for input(s): PROBNP in the last 8760 hours. HbA1C: No results for input(s): HGBA1C in the last 72 hours. CBG: Recent Labs  Lab 12/30/24 2135 12/31/24 0731 12/31/24 1106  GLUCAP 136* 125* 117*   Lipid Profile: No results for input(s): CHOL, HDL, LDLCALC, TRIG, CHOLHDL, LDLDIRECT in the last 72 hours. Thyroid Function Tests: Recent Labs  12/30/24 0440  TSH 0.808   Anemia Panel: No results for input(s): VITAMINB12, FOLATE, FERRITIN, TIBC, IRON, RETICCTPCT in the last 72 hours. Sepsis Labs: Recent Labs  Lab 12/29/24 1854 12/30/24 0440 12/30/24 1349  LATICACIDVEN 1.8 2.3* 0.9    Recent Results (from the past 240 hours)  Blood Culture (routine x 2)     Status: Abnormal (Preliminary result)   Collection Time: 12/29/24  6:45 PM   Specimen: BLOOD  Result Value Ref Range Status   Specimen Description   Final    BLOOD RIGHT ANTECUBITAL Performed at [facility], [address] [place], [place] [address]    Special Requests    Final    BOTTLES DRAWN AEROBIC AND ANAEROBIC Blood Culture adequate volume Performed at [facility], [address], [place], [place] [address]    Culture  Setup Time   Final    GRAM NEGATIVE RODS IN BOTH AEROBIC AND ANAEROBIC BOTTLES CRITICAL RESULT CALLED TO, READ BACK BY AND VERIFIED WITH: PHARMD MARY  S [id number] FCP    Culture (A)  Final    KLEBSIELLA PNEUMONIAE SUSCEPTIBILITIES TO FOLLOW Performed at [facility], [address], [place], [place] [address]    Report Status PENDING  Incomplete  Resp panel by RT-PCR (RSV, Flu A&B, Covid) Anterior Nasal Swab     Status: None   Collection Time: 12/29/24  6:45 PM   Specimen: Anterior Nasal Swab  Result Value Ref Range Status   SARS Coronavirus 2 by RT PCR NEGATIVE NEGATIVE Final    Comment: (NOTE) SARS-CoV-2 target nucleic acids are NOT DETECTED.  The SARS-CoV-2 RNA is generally detectable in upper respiratory specimens during the acute phase of infection. The lowest concentration of SARS-CoV-2 viral copies this assay can detect is 138 copies/mL. A negative result does not preclude SARS-Cov-2 infection and should not be used as the sole basis for treatment or other patient management decisions. A negative result may occur with  improper specimen collection/handling, submission of specimen other than nasopharyngeal swab, presence of viral mutation(s) within the areas targeted by this assay, and inadequate number of viral copies(<138 copies/mL). A negative result must be combined with clinical observations, patient history, and epidemiological information. The expected result is Negative.  Fact Sheet for Patients:  bloggercourse.com  Fact Sheet for Healthcare Providers:  seriousbroker.it  This test is no t yet approved or cleared by the [place]  FDA and  has been authorized for detection and/or diagnosis of SARS-CoV-2 by FDA under an Emergency  Use Authorization (EUA). This EUA will remain  in effect (meaning this test can be used) for the duration of the COVID-19 declaration under Section 564(b)(1) of the Act, 21 U.S.C.section 360bbb-3(b)(1), unless the authorization is terminated  or revoked sooner.       Influenza A by PCR NEGATIVE NEGATIVE Final   Influenza B by PCR NEGATIVE NEGATIVE Final    Comment: (NOTE) The Xpert Xpress SARS-CoV-2/FLU/RSV plus assay is intended as an aid in the diagnosis of influenza from Nasopharyngeal swab specimens and should not be used as a sole basis for treatment. Nasal washings and aspirates are unacceptable for Xpert Xpress SARS-CoV-2/FLU/RSV testing.  Fact Sheet for Patients: bloggercourse.com  Fact Sheet for Healthcare Providers: seriousbroker.it  This test is not yet approved or cleared by the [place]  FDA and has been authorized for detection and/or diagnosis of SARS-CoV-2 by FDA under an Emergency Use Authorization (EUA). This EUA will remain in effect (meaning this test can  be used) for the duration of the COVID-19 declaration under Section 564(b)(1) of the Act, 21 U.S.C. section 360bbb-3(b)(1), unless the authorization is terminated or revoked.     Resp Syncytial Virus by PCR NEGATIVE NEGATIVE Final    Comment: (NOTE) Fact Sheet for Patients: bloggercourse.com  Fact Sheet for Healthcare Providers: seriousbroker.it  This test is not yet approved or cleared by the [place]  FDA and has been authorized for detection and/or diagnosis of SARS-CoV-2 by FDA under an Emergency Use Authorization (EUA). This EUA will remain in effect (meaning this test can be used) for the duration of the COVID-19 declaration under Section 564(b)(1) of the Act, 21 U.S.C. section 360bbb-3(b)(1), unless the authorization is terminated or revoked.  Performed at [facility],  [address], [place], [place] [address]   Blood Culture ID Panel (Reflexed)     Status: Abnormal   Collection Time: 12/29/24  6:45 PM  Result Value Ref Range Status   Enterococcus faecalis NOT DETECTED NOT DETECTED Final   Enterococcus Faecium NOT DETECTED NOT DETECTED Final   Listeria monocytogenes NOT DETECTED NOT DETECTED Final   Staphylococcus species NOT DETECTED NOT DETECTED Final   Staphylococcus aureus (BCID) NOT DETECTED NOT DETECTED Final   Staphylococcus epidermidis NOT DETECTED NOT DETECTED Final   Staphylococcus lugdunensis NOT DETECTED NOT DETECTED Final   Streptococcus species NOT DETECTED NOT DETECTED Final   Streptococcus agalactiae NOT DETECTED NOT DETECTED Final   Streptococcus pneumoniae NOT DETECTED NOT DETECTED Final   Streptococcus pyogenes NOT DETECTED NOT DETECTED Final   A.calcoaceticus-baumannii NOT DETECTED NOT DETECTED Final   Bacteroides fragilis NOT DETECTED NOT DETECTED Final   Enterobacterales DETECTED (A) NOT DETECTED Final    Comment: Enterobacterales represent a large order of gram negative bacteria, not a single organism. CRITICAL RESULT CALLED TO, READ BACK BY AND VERIFIED WITH: PHARMD MARY  S 1046 [id number] FCP    Enterobacter cloacae complex NOT DETECTED NOT DETECTED Final   Escherichia coli NOT DETECTED NOT DETECTED Final   Klebsiella aerogenes NOT DETECTED NOT DETECTED Final   Klebsiella oxytoca NOT DETECTED NOT DETECTED Final   Klebsiella pneumoniae DETECTED (A) NOT DETECTED Final    Comment: CRITICAL RESULT CALLED TO, READ BACK BY AND VERIFIED WITH: PHARMD MARY  S 1046 [id number] FCP    Proteus species NOT DETECTED NOT DETECTED Final   Salmonella species NOT DETECTED NOT DETECTED Final   Serratia marcescens NOT DETECTED NOT DETECTED Final   Haemophilus influenzae NOT DETECTED NOT DETECTED Final   Neisseria meningitidis NOT DETECTED NOT DETECTED Final   Pseudomonas aeruginosa NOT DETECTED NOT DETECTED Final   Stenotrophomonas maltophilia NOT  DETECTED NOT DETECTED Final   Candida albicans NOT DETECTED NOT DETECTED Final   Candida auris NOT DETECTED NOT DETECTED Final   Candida glabrata NOT DETECTED NOT DETECTED Final   Candida krusei NOT DETECTED NOT DETECTED Final   Candida parapsilosis NOT DETECTED NOT DETECTED Final   Candida tropicalis NOT DETECTED NOT DETECTED Final   Cryptococcus neoformans/gattii NOT DETECTED NOT DETECTED Final   CTX-M ESBL NOT DETECTED NOT DETECTED Final   Carbapenem resistance IMP NOT DETECTED NOT DETECTED Final   Carbapenem resistance KPC NOT DETECTED NOT DETECTED Final   Carbapenem resistance NDM NOT DETECTED NOT DETECTED Final   Carbapenem resist OXA 48 LIKE NOT DETECTED NOT DETECTED Final   Carbapenem resistance VIM NOT DETECTED NOT DETECTED Final    Comment: Performed at [facility], [address], [place], [place] [address]  Urine  Culture     Status: Abnormal (Preliminary result)   Collection Time: 12/29/24 11:31 PM   Specimen: Urine, Clean Catch  Result Value Ref Range Status   Specimen Description   Final    URINE, CLEAN CATCH Performed at [facility], [address] [place], [place] [address]    Special Requests   Final    NONE Performed at [facility], [address], [place], [place] [address]    Culture (A)  Final    >=100,000 COLONIES/mL KLEBSIELLA PNEUMONIAE SUSCEPTIBILITIES TO FOLLOW CULTURE REINCUBATED FOR BETTER GROWTH Performed at [facility], [address], [place], [place] [address]    Report Status PENDING  Incomplete  Gastrointestinal Panel by PCR , Stool     Status: None   Collection Time: 12/30/24  3:16 AM   Specimen: STOOL  Result Value Ref Range Status   Campylobacter species NOT DETECTED NOT DETECTED Final   Plesimonas shigelloides NOT DETECTED NOT DETECTED Final   Salmonella species NOT DETECTED NOT DETECTED Final   Yersinia enterocolitica NOT DETECTED NOT DETECTED Final   Vibrio species NOT DETECTED NOT  DETECTED Final   Vibrio cholerae NOT DETECTED NOT DETECTED Final   Enteroaggregative E coli (EAEC) NOT DETECTED NOT DETECTED Final   Enteropathogenic E coli (EPEC) NOT DETECTED NOT DETECTED Final   Enterotoxigenic E coli (ETEC) NOT DETECTED NOT DETECTED Final   Shiga like toxin producing E coli (STEC) NOT DETECTED NOT DETECTED Final   Shigella/Enteroinvasive E coli (EIEC) NOT DETECTED NOT DETECTED Final   Cryptosporidium NOT DETECTED NOT DETECTED Final   Cyclospora cayetanensis NOT DETECTED NOT DETECTED Final   Entamoeba histolytica NOT DETECTED NOT DETECTED Final   Giardia lamblia NOT DETECTED NOT DETECTED Final   Adenovirus F40/41 NOT DETECTED NOT DETECTED Final   Astrovirus NOT DETECTED NOT DETECTED Final   Norovirus GI/GII NOT DETECTED NOT DETECTED Final   Rotavirus A NOT DETECTED NOT DETECTED Final   Sapovirus (I, II, IV, and V) NOT DETECTED NOT DETECTED Final    Comment: Performed at [facility], [address] [place], [place] [address]  Blood Culture (routine x 2)     Status: None (Preliminary result)   Collection Time: 12/30/24  1:49 PM   Specimen: BLOOD RIGHT HAND  Result Value Ref Range Status   Specimen Description   Final    BLOOD RIGHT HAND Performed at [facility], [address] [place], [place] [address]    Special Requests   Final    BOTTLES DRAWN AEROBIC AND ANAEROBIC Blood Culture adequate volume Performed at [facility], [address] [place], [place] [address]    Culture   Final    NO GROWTH < 24 HOURS Performed at [facility], [address], [place], [place] [address]    Report Status PENDING  Incomplete         Radiology Studies: DG C-Arm 1-60 Min-No Report Result Date: 12/30/2024 Fluoroscopy was utilized by the requesting physician.  No radiographic interpretation.   CT HEAD WO CONTRAST ( ) Result Date: 12/30/2024 EXAM: CT HEAD WITHOUT CONTRAST 12/30/2024 07:15:00 AM TECHNIQUE: CT of the head was  performed without the administration of intravenous contrast. Automated exposure control, iterative reconstruction, and/or weight based adjustment of the mA/kV was utilized to reduce the radiation dose to as low as reasonably achievable. COMPARISON: 02/14/2021. CLINICAL HISTORY: Mental status change, unknown cause. The patient presents with a mental status change of unknown cause.  FINDINGS: BRAIN AND VENTRICLES: No acute hemorrhage. No evidence of acute infarct. No hydrocephalus. No extra-axial collection. No mass effect or midline shift. ORBITS: No acute abnormality. SINUSES: No acute abnormality. SOFT TISSUES AND SKULL: No acute soft tissue abnormality. No skull fracture. IMPRESSION: 1. No acute intracranial abnormality. Electronically signed by: Waddell Calk MD 12/30/2024 07:33 AM EST RP Workstation: [id number]   CT CHEST ABDOMEN PELVIS WO CONTRAST Result Date: 12/30/2024 EXAM: CT CHEST, ABDOMEN AND PELVIS WITHOUT CONTRAST 12/30/2024 06:18:23 AM TECHNIQUE: CT of the chest, abdomen and pelvis was performed without the administration of intravenous contrast. Multiplanar reformatted images are provided for review. Automated exposure control, iterative reconstruction, and/or weight based adjustment of the mA/kV was utilized to reduce the radiation dose to as low as reasonably achievable. COMPARISON: CT of the abdomen and pelvis dated 06/22/2015. CLINICAL HISTORY: Sepsis. FINDINGS: CHEST: MEDIASTINUM AND LYMPH NODES: Heart demonstrates mild-to-moderate calcific coronary artery disease. Pericardium is unremarkable. The central airways are clear. No mediastinal, hilar or axillary lymphadenopathy. LUNGS AND PLEURA: Ground-glass and streaky opacities present independently within the lower lobes bilaterally. The nondependent lungs are clear. Tiny bilateral pleural effusions. No pneumothorax. ABDOMEN AND PELVIS: LIVER: Unremarkable. GALLBLADDER AND BILE DUCTS: Numerous calcified stones lying dependently within the  gallbladder. No definite evidence of cholecystitis. No biliary ductal dilatation. SPLEEN: No acute abnormality. PANCREAS: No acute abnormality. ADRENAL GLANDS: No acute abnormality. KIDNEYS, URETERS AND BLADDER: Left kidney: Ovoid calculus obstructing the left mid ureter measuring approximately 8 x 4 x 6 mm. Moderate-to-severe left hydronephrosis and perinephric fatty stranding. Nonobstructive calculus in the lower pole of the left kidney, measuring approximately 9 mm in diameter. Right kidney: Couple of calculi present within the lower pole of the right kidney, with the larger calculus measuring 3 mm in diameter. No evidence of obstructive uropathy on the right. Urinary bladder is unremarkable. GI AND BOWEL: Stomach demonstrates no acute abnormality. There is no bowel obstruction. REPRODUCTIVE ORGANS: Status post hysterectomy and bilateral salpingo-oophorectomy. PERITONEUM AND RETROPERITONEUM: No ascites. No free air. VASCULATURE: Abdominal aorta is normal in caliber and demonstrates moderate calcific atheromatous disease. Thoracic aorta demonstrates mild calcific atheromatous disease. ABDOMINAL AND PELVIS LYMPH NODES: No lymphadenopathy. BONES AND SOFT TISSUES: Moderate dextroscoliosis of the thoracolumbar spine. Moderate diffuse chronic degenerative disc disease present throughout it. Status post left total hip arthroplasty. No focal soft tissue abnormality. IMPRESSION: 1. Ovoid calculus obstructing the left mid ureter measuring approximately 8 x 4 x 6 mm, with moderate-to-severe left hydronephrosis and perinephric fatty stranding. 2. Nonobstructive calculus in the lower pole of the left kidney, measuring approximately 9 mm in diameter. 3. Couple of calculi in the lower pole of the right kidney, with the larger calculus measuring 3 mm in diameter. No evidence of obstructive uropathy on the right. 4. Numerous calcified stones lying dependently within the gallbladder. No definite evidence of cholecystitis. 5.  Ground-glass and streaky opacities in the lower lobes bilaterally with tiny bilateral pleural effusions. 6. Mild-to-moderate calcific coronary artery disease. Electronically signed by: Evalene Coho MD 12/30/2024 06:40 AM EST RP Workstation: [id number]   DG Chest Port 1 View Result Date: 12/29/2024 CLINICAL DATA:  Questionable sepsis. EXAM: PORTABLE CHEST 1 VIEW COMPARISON:  Chest radiograph dated 12/16/2020. FINDINGS: No focal consolidation, pleural effusion, pneumothorax. The cardiac silhouette is within normal limits. Degenerative changes of the spine and scoliosis. No acute osseous pathology. IMPRESSION: No active disease. Electronically Signed   By: Vanetta Chou M.D.   On: 12/29/2024 18:47        Scheduled Meds:  enoxaparin  (LOVENOX ) injection  30 mg Subcutaneous Q24H   pantoprazole   40 mg Oral Daily   rosuvastatin   10 mg Oral Daily   zolpidem   5 mg Oral QHS   Continuous Infusions:  cefTRIAXone  (ROCEPHIN )  IV 2 g (12/30/24 2000)   lactated ringers  75 mL/hr at 12/31/24 0945     LOS: 1 day    Time spent: 45 minutes spent on 12/31/2024 caring for this patient face-to-face including chart review, ordering labs/tests, documenting, discussion with nursing staff, consultants, updating family and interview/physical exam    Camellia PARAS Geanine Vandekamp, DO [facility] Available via Epic secure chat 7am-7pm After these hours, please refer to coverage provider listed on [email or web address] 12/31/2024, 1:59 PM   "

## 2024-12-31 NOTE — TOC Initial Note (Signed)
 Transition of Care [facility]) - Initial/Assessment Note    Patient Details  Name: Annette Hernandez MRN: [id number] Date of Birth: 06/22/1941  [facility]) CM/SW Contact:    Tawni CHRISTELLA Eva, LCSW Phone Number: 12/31/2024, 4:53 PM  Clinical Narrative:                   CSW met with the pt to discuss discharge planning. The pt reports she is from home with her spouse. Pt stated that no home care services are in place at this time. She inquired about home health physical therapy for balance. CSW informed her that PT recommendations are pending and that CSW will follow up once recommendations are made. Pt reports having a walker at home and has no DME needs at this time. The pts family will provide transportation upon discharge. ICM to follow for discharge needs.  Expected Discharge Plan: Home w Home Health Services Barriers to Discharge: Continued Medical Work up   Patient Goals and CMS Choice Patient states their goals for this hospitalization and ongoing recovery are:: retrun home          Expected Discharge Plan and Services                                              Prior Living Arrangements/Services   Lives with:: Self, Spouse Patient language and need for interpreter reviewed:: Yes Do you feel safe going back to the place where you live?: Yes      Need for Family Participation in Patient Care: No (Comment) Care giver support system in place?: No (comment)   Criminal Activity/Legal Involvement Pertinent to Current Situation/Hospitalization: No - Comment as needed  Activities of Daily Living   ADL Screening (condition at time of admission) Independently performs ADLs?: No Does the patient have a NEW difficulty with bathing/dressing/toileting/self-feeding that is expected to last >3 days?: Yes (Initiates electronic notice to provider for possible OT consult) Does the patient have a NEW difficulty with getting in/out of bed, walking, or  climbing stairs that is expected to last >3 days?: Yes (Initiates electronic notice to provider for possible PT consult) Does the patient have a NEW difficulty with communication that is expected to last >3 days?: No Is the patient deaf or have difficulty hearing?: No Does the patient have difficulty seeing, even when wearing glasses/contacts?: No Does the patient have difficulty concentrating, remembering, or making decisions?: No  Permission Sought/Granted                  Emotional Assessment Appearance:: Appears stated age Attitude/Demeanor/Rapport: Gracious Affect (typically observed): Accepting Orientation: : Oriented to Self, Oriented to Place, Oriented to  Time, Oriented to Situation      Admission diagnosis:  Weakness [R53.1] Sepsis (HCC) [A41.9] Patient Active Problem List   Diagnosis Date Noted   HLD (hyperlipidemia) 12/29/2024   ARF (acute renal failure) 12/29/2024   Weakness 12/29/2024   UTI (urinary tract infection) 12/17/2020   Sepsis (HCC) 12/17/2020   Confusion 12/17/2020   Essential hypertension 12/17/2020   Leukocytosis 12/17/2020   Hyponatremia 12/17/2020   Weight loss, non-intentional 09/21/2011   Foreign body of hip or leg, superficial, infected 07/18/2011   Osteomyelitis of left hip (HCC) 07/18/2011   Bacteremia due to Gram-positive bacteria 07/18/2011   Depression, acute 07/18/2011   PCP:  Cleotilde Planas, MD Pharmacy:  [facility] [address] [place], [place] - [address] [place] [address] Phone: [phone] Fax: [phone]     Social Drivers of Health (SDOH) Social History: SDOH Screenings   Food Insecurity: No Food Insecurity (12/30/2024)  Housing: Low Risk (12/30/2024)  Transportation Needs: No Transportation Needs (12/30/2024)  Utilities: Not At Risk (12/30/2024)  Social Connections: Socially Integrated (12/30/2024)  Tobacco Use: Low Risk (12/30/2024)   SDOH Interventions:      Readmission Risk Interventions     No data to display

## 2024-12-31 NOTE — Progress Notes (Signed)
 PT Cancellation Note / Screen  Patient Details Name: Annette Hernandez MRN: [id number] DOB: [birth year]   Cancelled Treatment:    Reason Eval/Treat Not Completed: PT screened, no needs identified, will sign off Pt observed ambulating in hallway with OT.  No PT needs identified at this time.  PT to sign off.   Kati L Payson 12/31/2024, 11:10 AM Tari KLEIN, DPT Physical Therapist Acute Rehabilitation Services Office: [phone]

## 2025-01-01 DIAGNOSIS — B9620 Unspecified Escherichia coli [E. coli] as the cause of diseases classified elsewhere: Secondary | ICD-10-CM | POA: Diagnosis not present

## 2025-01-01 DIAGNOSIS — R7881 Bacteremia: Secondary | ICD-10-CM | POA: Diagnosis not present

## 2025-01-01 DIAGNOSIS — B961 Klebsiella pneumoniae [K. pneumoniae] as the cause of diseases classified elsewhere: Secondary | ICD-10-CM | POA: Diagnosis not present

## 2025-01-01 DIAGNOSIS — L8990 Pressure ulcer of unspecified site, unspecified stage: Secondary | ICD-10-CM | POA: Insufficient documentation

## 2025-01-01 DIAGNOSIS — N390 Urinary tract infection, site not specified: Secondary | ICD-10-CM | POA: Diagnosis not present

## 2025-01-01 LAB — CBC
HCT: 28.6 % — ABNORMAL LOW (ref 36.0–46.0)
Hemoglobin: 9.7 g/dL — ABNORMAL LOW (ref 12.0–15.0)
MCH: 30.5 pg (ref 26.0–34.0)
MCHC: 33.9 g/dL (ref 30.0–36.0)
MCV: 89.9 fL (ref 80.0–100.0)
Platelets: 165 K/uL (ref 150–400)
RBC: 3.18 MIL/uL — ABNORMAL LOW (ref 3.87–5.11)
RDW: 13.3 % (ref 11.5–15.5)
WBC: 23.4 K/uL — ABNORMAL HIGH (ref 4.0–10.5)
nRBC: 0 % (ref 0.0–0.2)

## 2025-01-01 MED ORDER — TRAMADOL HCL 50 MG PO TABS
50.0000 mg | ORAL_TABLET | Freq: Four times a day (QID) | ORAL | Status: DC | PRN
  Administered 2025-01-01 – 2025-01-02 (×2): 50 mg via ORAL
  Filled 2025-01-01 (×2): qty 1

## 2025-01-01 MED ORDER — LOSARTAN POTASSIUM 50 MG PO TABS
50.0000 mg | ORAL_TABLET | Freq: Every day | ORAL | Status: DC
  Administered 2025-01-01 – 2025-01-02 (×2): 50 mg via ORAL
  Filled 2025-01-01 (×2): qty 1

## 2025-01-01 NOTE — Progress Notes (Addendum)
 Patient ID: Annette Hernandez, female   DOB: Mar 27, 1941, 84 y.o.   MRN: [id number]  2 Days Post-Op Subjective: Pt continues to feel improved.  No complaints this morning.  Denies flank pain.  Objective: Vital signs in last 24 hours: Temp:  [97.6 F (36.4 C)-98.1 F (36.7 C)] 98 F (36.7 C) (01/16 0440) Pulse Rate:  [56-78] 78 (01/16 0656) Resp:  [18-20] 20 (01/16 0440) BP: (132-172)/(66-80) 132/66 (01/16 0656) SpO2:  [95 %-98 %] 97 % (01/16 0440)  Intake/Output from previous day: 01/15 0701 - 01/16 0700 In: 340 [P.O.:240; IV Piggyback:100] Out: -  Intake/Output this shift: No intake/output data recorded.  Physical Exam:  General: Alert and oriented GU: No CVAT  Lab Results: Recent Labs    12/30/24 0440 12/31/24 0424 01/01/25 0414  HGB 10.4* 9.3* 9.7*  HCT 31.4* 27.8* 28.6*      Latest Ref Rng & Units 01/01/2025    4:14 AM 12/31/2024    4:24 AM 12/30/2024    4:40 AM  CBC  WBC 4.0 - 10.5 K/uL 23.4  23.9  36.6   Hemoglobin 12.0 - 15.0 g/dL 9.7  9.3  89.5   Hematocrit 36.0 - 46.0 % 28.6  27.8  31.4   Platelets 150 - 400 K/uL 165  156  182     BMET Recent Labs    12/30/24 0440 12/31/24 0424  NA 137 135  K 3.9 4.2  CL 105 107  CO2 21* 20*  GLUCOSE 126* 127*  BUN 34* 34*  CREATININE 1.25* 0.97  CALCIUM  8.5* 8.4*     Studies/Results: Klebsiella growing in urine and blood cultures.  Assessment/Plan: 1) Left ureteral and renal calculi: S/P left ureteral stent placement 1/14. Final sensitivities pending. Tailor antibiotic therapy as appropriate and for total of 2 week treatment.  I will arrange outpatient follow up in about 2 weeks.  Will then plan for definitive stone treatment likely with ureteroscopic therapy after further discussion this morning.  Please call if further questions.   LOS: 2 days   Annette Hernandez 01/01/2025, 7:43 AM

## 2025-01-01 NOTE — TOC Progression Note (Signed)
 Transition of Care [facility]) - Progression Note   Patient Details  Name: Evamae Rowen MRN: [id number] Date of Birth: Feb 20, 1941  [facility]) CM/SW Contact  Duwaine GORMAN Aran, LCSW Phone Number: 01/01/2025, 11:49 AM  Clinical Narrative: PT and OT did not recommend any follow after discharge. CSW notified patient.  Expected Discharge Plan: Home/Self Care Barriers to Discharge: Continued Medical Work up  Expected Discharge Plan and Services              DME Arranged: N/A DME Agency: NA  Social Drivers of Health (SDOH) Interventions SDOH Screenings   Food Insecurity: No Food Insecurity (12/30/2024)  Housing: Low Risk (12/30/2024)  Transportation Needs: No Transportation Needs (12/30/2024)  Utilities: Not At Risk (12/30/2024)  Social Connections: Socially Integrated (12/30/2024)  Tobacco Use: Low Risk (12/30/2024)   Readmission Risk Interventions     No data to display

## 2025-01-01 NOTE — Progress Notes (Signed)
 " PROGRESS NOTE    Annette Hernandez  FMW:[id number] DOB: 12-16-1941 DOA: 12/29/2024 PCP: Cleotilde Planas, MD    Brief Narrative:   Annette Hernandez is a 84 y.o. female with past medical history significant for HTN, HLD, GERD who presented to [facility] ED on 12/30/2023 from home via EMS with complaints of weakness, lethargy over the last 2 days.  Reports frequent urination.  Additionally patient with left forearm wound after fall over the last 2 weeks that is poorly healed.  In the ED, temperature 98.5 F, HR 107, RR 27, BP 120/56, SpO2 97% on room air.  WBC 40.0, hemoglobin 12.2, platelet count 247.  Sodium 134, potassium 3.7, chloride 99, CO2 22, glucose 111, BUN 36, creatinine 1.36.  AST 28, ALT 13, total bilirubin 0.7.  Lactic acid 1.8.  COVID/influenza/RSV PCR negative.  Urinalysis with large leukocytes, negative nitrite, many bacteria, greater than 50 WBCs.  Blood cultures x 2 and urine culture obtained.  CT head without contrast with no acute intracranial abnormality.  CT chest/abdomen/pelvis without contrast with ovoid calculus obstructing the left mid ureter measuring 8 x 4 x 6 mm with moderate to severe left hydronephrosis and perinephric fatty stranding, nonobstructive calculus lower pole left kidney measuring approximately 9 mm in diameter, ground glass and streaky opacities lower lobes bilaterally with tiny bilateral pleural effusions, mild/moderate calcific coronary artery disease.  Patient started empiric antibiotics.  TRH consulted for admission for further evaluation management of sepsis likely secondary to UTI/pyelonephritis.  Assessment & Plan:   Sepsis, POA Left pyelonephritis Klebsiella bacteremia Klebsiella/E. 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  Patient was afebrile, tachycardic, tachypneic with elevated WBC count of 40.0 at time of  admission.  Urinalysis consistent with UTI.  CT head without contrast with no acute abnormality.  CT chest/abdomen/pelvis with findings of severe left hydronephrosis with obstructive calculus measuring 8 x 4 x 6 mm with perinephric stranding on the left consistent with pyelonephritis.  Patient was started empiric antibiotics, urology consulted and patient underwent cystoscopy with left ureteral stent placement by Dr. Renda on 12/30/2024. -- WBC 40.0>38.5>36.6>23.9>23.4 -- Urine Culture: > 100K Klebsiella pneumoniae and Ecoli, pending susceptibilities -- Blood Culture x 2 : + Klebsiella pneumoniae, pending susceptibilities -- Ceftriaxone  2 g IV every 24 hours -- CBC in the a.m. -- Will need close follow-up with urology outpatient for definitive stone treatment -- Per ID Pharm.D. recommends 14-day total antibiotic course given stent in place  Acute renal failure Baseline creatinine 0.72.  Creatinine elevated on admission 1.36, likely secondary to sepsis/bacteremia, hydronephrosis from obstructive nephrolithiasis as above. -- Cr 1.36>1.25>0.97 -- S/p cystoscopy with stent placement 1/14 -- Resuming home losartan  -- Repeat BMP in a.m.  Left forearm wound Seen by wound RN. Cut piece of Aquacel and apply to left arm wound Q day, then cover with foam dressing. Change foam dressing Q 3 days or PRN. Moisten previous Aquacel with NS each time to assist with removal.   HTN -- Resume home losartan  today -- Repeat BMP in a.m.  HLD -- Crestor  10 mg p.o. daily  GERD -- Protonix  40 mg PO daily  Insomnia -- Ambien  5 mg p.o. nightly   DVT prophylaxis: enoxaparin  (LOVENOX ) injection 30 mg Start: 12/30/24 1000    Code Status: Full Code Family Communication: Updated spouse present at bedside  Disposition Plan:  Level of care: Med-Surg Status is: Inpatient Remains inpatient appropriate because: IV  antibiotics, awaiting culture sensitivities    Consultants:  Urology, Dr. Rudolpho  Procedures:   Cystoscopy with left ureteral stent placement, 1/14; Dr. Renda  Antimicrobials:  Vancomycin  1/13 - 1/13 Ceftriaxone  1/13>>   Subjective: Patient seen examined bedside, sitting on edge of bed.  RN present at bedside.  No complaints.  WBC count about the same today.  Continue to await culture sensitivities in order to transition to oral antibiotics.  Seen by urology with plan outpatient follow-up for definitive stone treatment. No other specific questions, concerns or complaints at this time.  Denies headache, no dizziness, no chest pain, no palpitations, no shortness of breath, no current abdominal pain, no fever/chills/night sweats, no nausea/vomiting/diarrhea.  No acute events overnight per nursing staff.  Objective: Vitals:   12/31/24 1401 12/31/24 2031 01/01/25 0440 01/01/25 0656  BP: (!) 148/76 (!) 150/68 (!) 172/80 132/66  Pulse: 69 68 (!) 56 78  Resp: 18 20 20    Temp: 97.6 F (36.4 C) 98.1 F (36.7 C) 98 F (36.7 C)   TempSrc: Oral Oral Oral   SpO2: 98% 95% 97%   Weight:      Height:        Intake/Output Summary (Last 24 hours) at 01/01/2025 1246 Last data filed at 01/01/2025 0500 Gross per 24 hour  Intake 340 ml  Output --  Net 340 ml   Filed Weights   12/29/24 1821  Weight: 49 kg    Examination:  Physical Exam: GEN: NAD, alert and oriented x 3, elderly appearance HEENT: NCAT, PERRL, EOMI, sclera clear, MMM PULM: CTAB w/o wheezes/crackles, normal respiratory effort, room air CV: RRR w/o M/G/R GI: abd soft, NTND, bowel sounds present MSK: no peripheral edema, moves all extremities independently with preserved muscle strength NEURO: No focal neurological deficit PSYCH: normal mood/affect Integumentary: Left forearm/elbow wound as depicted below, otherwise no concerning rashes/lesions/wounds nonexposed skin surfaces       Data Reviewed: I have personally reviewed following labs and imaging studies  CBC: Recent Labs  Lab 12/29/24 1845 12/30/24 0316  12/30/24 0440 12/31/24 0424 01/01/25 0414  WBC 40.0* 38.5* 36.6* 23.9* 23.4*  NEUTROABS 36.2*  --  33.8*  --   --   HGB 12.2 10.8* 10.4* 9.3* 9.7*  HCT 36.2 32.2* 31.4* 27.8* 28.6*  MCV 91.2 91.5 92.6 91.1 89.9  PLT 247 198 182 156 165   Basic Metabolic Panel: Recent Labs  Lab 12/29/24 1845 12/30/24 0440 12/31/24 0424  NA 134* 137 135  K 3.7 3.9 4.2  CL 99 105 107  CO2 22 21* 20*  GLUCOSE 111* 126* 127*  BUN 36* 34* 34*  CREATININE 1.36* 1.25* 0.97  CALCIUM  9.3 8.5* 8.4*   GFR: Estimated Creatinine Clearance: 32.8 mL/min (by C-G formula based on SCr of 0.97 mg/dL). Liver Function Tests: Recent Labs  Lab 12/29/24 1845 12/30/24 0440  AST 28 27  ALT 13 13  ALKPHOS 108 90  BILITOT 0.7 0.3  PROT 6.8 5.6*  ALBUMIN 3.9 3.2*   No results for input(s): LIPASE, AMYLASE in the last 168 hours. No results for input(s): AMMONIA in the last 168 hours. Coagulation Profile: Recent Labs  Lab 12/29/24 1845  INR 1.1   Cardiac Enzymes: Recent Labs  Lab 12/30/24 0440  CKTOTAL 206   BNP (last 3 results) No results for input(s): PROBNP in the last 8760 hours. HbA1C: No results for input(s): HGBA1C in the last 72 hours. CBG: Recent Labs  Lab 12/30/24 2135 12/31/24 0731 12/31/24 1106  GLUCAP 136* 125*  117*   Lipid Profile: No results for input(s): CHOL, HDL, LDLCALC, TRIG, CHOLHDL, LDLDIRECT in the last 72 hours. Thyroid Function Tests: Recent Labs    12/30/24 0440  TSH 0.808   Anemia Panel: No results for input(s): VITAMINB12, FOLATE, FERRITIN, TIBC, IRON, RETICCTPCT in the last 72 hours. Sepsis Labs: Recent Labs  Lab 12/29/24 1854 12/30/24 0440 12/30/24 1349  LATICACIDVEN 1.8 2.3* 0.9    Recent Results (from the past 240 hours)  Blood Culture (routine x 2)     Status: Abnormal (Preliminary result)   Collection Time: 12/29/24  6:45 PM   Specimen: BLOOD  Result Value Ref Range Status   Specimen Description   Final     BLOOD RIGHT ANTECUBITAL Performed at [facility], [address] [place], [place] [address]    Special Requests   Final    BOTTLES DRAWN AEROBIC AND ANAEROBIC Blood Culture adequate volume Performed at [facility], [address] [place], [place] [address]    Culture  Setup Time   Final    GRAM NEGATIVE RODS IN BOTH AEROBIC AND ANAEROBIC BOTTLES CRITICAL RESULT CALLED TO, READ BACK BY AND VERIFIED WITH: PHARMD MARY  S [id number] [id number] FCP Performed at [facility], [address], [place], [place] [address]    Culture KLEBSIELLA PNEUMONIAE (A)  Final   Report Status PENDING  Incomplete   Organism ID, Bacteria KLEBSIELLA PNEUMONIAE  Final      Susceptibility   Klebsiella pneumoniae - MIC*    AMPICILLIN RESISTANT Resistant     CEFAZOLIN (NON-URINE) 2 SENSITIVE Sensitive     CEFEPIME <=0.12 SENSITIVE Sensitive     ERTAPENEM <=0.12 SENSITIVE Sensitive     CEFTRIAXONE  <=0.25 SENSITIVE Sensitive     CIPROFLOXACIN  <=0.06 SENSITIVE Sensitive     GENTAMICIN <=1 SENSITIVE Sensitive     MEROPENEM <=0.25 SENSITIVE Sensitive     TRIMETH/SULFA <=20 SENSITIVE Sensitive     AMPICILLIN/SULBACTAM 4 SENSITIVE Sensitive     PIP/TAZO Value in next row Sensitive      <=4 SENSITIVEThis is a modified FDA-approved test that has been validated and its performance characteristics determined by the reporting laboratory.  This laboratory is certified under the Clinical Laboratory Improvement Amendments CLIA as qualified to perform high complexity clinical laboratory testing.    * KLEBSIELLA PNEUMONIAE  Resp panel by RT-PCR (RSV, Flu A&B, Covid) Anterior Nasal Swab     Status: None   Collection Time: 12/29/24  6:45 PM   Specimen: Anterior Nasal Swab  Result Value Ref Range Status   SARS Coronavirus 2 by RT PCR NEGATIVE NEGATIVE Final    Comment: (NOTE) SARS-CoV-2 target nucleic acids are NOT DETECTED.  The SARS-CoV-2 RNA is generally detectable in upper  respiratory specimens during the acute phase of infection. The lowest concentration of SARS-CoV-2 viral copies this assay can detect is 138 copies/mL. A negative result does not preclude SARS-Cov-2 infection and should not be used as the sole basis for treatment or other patient management decisions. A negative result may occur with  improper specimen collection/handling, submission of specimen other than nasopharyngeal swab, presence of viral mutation(s) within the areas targeted by this assay, and inadequate number of viral copies(<138 copies/mL). A negative result must be combined with clinical observations, patient history, and epidemiological information. The expected result is Negative.  Fact Sheet for Patients:  bloggercourse.com  Fact Sheet for Healthcare Providers:  seriousbroker.it  This test is no t yet approved or cleared by the [place]  [place] FDA and  has been authorized for detection and/or diagnosis of SARS-CoV-2 by FDA under an Emergency Use Authorization (EUA). This EUA will remain  in effect (meaning this test can be used) for the duration of the COVID-19 declaration under Section 564(b)(1) of the Act, 21 U.S.C.section 360bbb-3(b)(1), unless the authorization is terminated  or revoked sooner.       Influenza A by PCR NEGATIVE NEGATIVE Final   Influenza B by PCR NEGATIVE NEGATIVE Final    Comment: (NOTE) The Xpert Xpress SARS-CoV-2/FLU/RSV plus assay is intended as an aid in the diagnosis of influenza from Nasopharyngeal swab specimens and should not be used as a sole basis for treatment. Nasal washings and aspirates are unacceptable for Xpert Xpress SARS-CoV-2/FLU/RSV testing.  Fact Sheet for Patients: bloggercourse.com  Fact Sheet for Healthcare Providers: seriousbroker.it  This test is not yet approved or cleared by the [place]  FDA and has been  authorized for detection and/or diagnosis of SARS-CoV-2 by FDA under an Emergency Use Authorization (EUA). This EUA will remain in effect (meaning this test can be used) for the duration of the COVID-19 declaration under Section 564(b)(1) of the Act, 21 U.S.C. section 360bbb-3(b)(1), unless the authorization is terminated or revoked.     Resp Syncytial Virus by PCR NEGATIVE NEGATIVE Final    Comment: (NOTE) Fact Sheet for Patients: bloggercourse.com  Fact Sheet for Healthcare Providers: seriousbroker.it  This test is not yet approved or cleared by the [place]  FDA and has been authorized for detection and/or diagnosis of SARS-CoV-2 by FDA under an Emergency Use Authorization (EUA). This EUA will remain in effect (meaning this test can be used) for the duration of the COVID-19 declaration under Section 564(b)(1) of the Act, 21 U.S.C. section 360bbb-3(b)(1), unless the authorization is terminated or revoked.  Performed at [facility], [address], [place], [place] [address]   Blood Culture ID Panel (Reflexed)     Status: Abnormal   Collection Time: 12/29/24  6:45 PM  Result Value Ref Range Status   Enterococcus faecalis NOT DETECTED NOT DETECTED Final   Enterococcus Faecium NOT DETECTED NOT DETECTED Final   Listeria monocytogenes NOT DETECTED NOT DETECTED Final   Staphylococcus species NOT DETECTED NOT DETECTED Final   Staphylococcus aureus (BCID) NOT DETECTED NOT DETECTED Final   Staphylococcus epidermidis NOT DETECTED NOT DETECTED Final   Staphylococcus lugdunensis NOT DETECTED NOT DETECTED Final   Streptococcus species NOT DETECTED NOT DETECTED Final   Streptococcus agalactiae NOT DETECTED NOT DETECTED Final   Streptococcus pneumoniae NOT DETECTED NOT DETECTED Final   Streptococcus pyogenes NOT DETECTED NOT DETECTED Final   A.calcoaceticus-baumannii NOT DETECTED NOT DETECTED Final   Bacteroides  fragilis NOT DETECTED NOT DETECTED Final   Enterobacterales DETECTED (A) NOT DETECTED Final    Comment: Enterobacterales represent a large order of gram negative bacteria, not a single organism. CRITICAL RESULT CALLED TO, READ BACK BY AND VERIFIED WITH: PHARMD MARY  S [id number] [id number] FCP    Enterobacter cloacae complex NOT DETECTED NOT DETECTED Final   Escherichia coli NOT DETECTED NOT DETECTED Final   Klebsiella aerogenes NOT DETECTED NOT DETECTED Final   Klebsiella oxytoca NOT DETECTED NOT DETECTED Final   Klebsiella pneumoniae DETECTED (A) NOT DETECTED Final    Comment: CRITICAL RESULT CALLED TO, READ BACK BY AND VERIFIED WITH: PHARMD MARY  S 1046 [id number] FCP    Proteus species NOT DETECTED NOT DETECTED Final   Salmonella species NOT DETECTED NOT DETECTED Final   Serratia marcescens NOT  DETECTED NOT DETECTED Final   Haemophilus influenzae NOT DETECTED NOT DETECTED Final   Neisseria meningitidis NOT DETECTED NOT DETECTED Final   Pseudomonas aeruginosa NOT DETECTED NOT DETECTED Final   Stenotrophomonas maltophilia NOT DETECTED NOT DETECTED Final   Candida albicans NOT DETECTED NOT DETECTED Final   Candida auris NOT DETECTED NOT DETECTED Final   Candida glabrata NOT DETECTED NOT DETECTED Final   Candida krusei NOT DETECTED NOT DETECTED Final   Candida parapsilosis NOT DETECTED NOT DETECTED Final   Candida tropicalis NOT DETECTED NOT DETECTED Final   Cryptococcus neoformans/gattii NOT DETECTED NOT DETECTED Final   CTX-M ESBL NOT DETECTED NOT DETECTED Final   Carbapenem resistance IMP NOT DETECTED NOT DETECTED Final   Carbapenem resistance KPC NOT DETECTED NOT DETECTED Final   Carbapenem resistance NDM NOT DETECTED NOT DETECTED Final   Carbapenem resist OXA 48 LIKE NOT DETECTED NOT DETECTED Final   Carbapenem resistance VIM NOT DETECTED NOT DETECTED Final    Comment: Performed at [facility], [address] [place], [place] [address]  Urine Culture     Status: Abnormal (Preliminary  result)   Collection Time: 12/29/24 11:31 PM   Specimen: Urine, Clean Catch  Result Value Ref Range Status   Specimen Description   Final    URINE, CLEAN CATCH Performed at [facility], [address] [place], [place] [address]    Special Requests   Final    NONE Performed at [facility], [address] [place], [place] [address]    Culture (A)  Final    >=100,000 COLONIES/mL KLEBSIELLA PNEUMONIAE >=100,000 COLONIES/mL ESCHERICHIA COLI SUSCEPTIBILITIES TO FOLLOW Performed at [facility], [address] [place], [place] [address]    Report Status PENDING  Incomplete   Organism ID, Bacteria KLEBSIELLA PNEUMONIAE (A)  Final      Susceptibility   Klebsiella pneumoniae - MIC*    AMPICILLIN RESISTANT Resistant     CEFAZOLIN (URINE) Value in next row Sensitive      2 SENSITIVEThis is a modified FDA-approved test that has been validated and its performance characteristics determined by the reporting laboratory.  This laboratory is certified under the Clinical Laboratory Improvement Amendments CLIA as qualified to perform high complexity clinical laboratory testing.    CEFEPIME Value in next row Sensitive      2 SENSITIVEThis is a modified FDA-approved test that has been validated and its performance characteristics determined by the reporting laboratory.  This laboratory is certified under the Clinical Laboratory Improvement Amendments CLIA as qualified to perform high complexity clinical laboratory testing.    ERTAPENEM Value in next row Sensitive      2 SENSITIVEThis is a modified FDA-approved test that has been validated and its performance characteristics determined by the reporting laboratory.  This laboratory is certified under the Clinical Laboratory Improvement Amendments CLIA as qualified to perform high complexity clinical laboratory testing.    CEFTRIAXONE  Value in next row Sensitive      2 SENSITIVEThis is a modified FDA-approved  test that has been validated and its performance characteristics determined by the reporting laboratory.  This laboratory is certified under the Clinical Laboratory Improvement Amendments CLIA as qualified to perform high complexity clinical laboratory testing.    CIPROFLOXACIN  Value in next row Sensitive      2 SENSITIVEThis is a modified FDA-approved test that has been validated and its performance characteristics determined by the reporting laboratory.  This laboratory is certified under the Clinical Laboratory Improvement  Amendments CLIA as qualified to perform high complexity clinical laboratory testing.    GENTAMICIN Value in next row Sensitive      2 SENSITIVEThis is a modified FDA-approved test that has been validated and its performance characteristics determined by the reporting laboratory.  This laboratory is certified under the Clinical Laboratory Improvement Amendments CLIA as qualified to perform high complexity clinical laboratory testing.    NITROFURANTOIN Value in next row Intermediate      2 SENSITIVEThis is a modified FDA-approved test that has been validated and its performance characteristics determined by the reporting laboratory.  This laboratory is certified under the Clinical Laboratory Improvement Amendments CLIA as qualified to perform high complexity clinical laboratory testing.    TRIMETH/SULFA Value in next row Sensitive      2 SENSITIVEThis is a modified FDA-approved test that has been validated and its performance characteristics determined by the reporting laboratory.  This laboratory is certified under the Clinical Laboratory Improvement Amendments CLIA as qualified to perform high complexity clinical laboratory testing.    AMPICILLIN/SULBACTAM Value in next row Sensitive      2 SENSITIVEThis is a modified FDA-approved test that has been validated and its performance characteristics determined by the reporting laboratory.  This laboratory is certified under the Clinical  Laboratory Improvement Amendments CLIA as qualified to perform high complexity clinical laboratory testing.    PIP/TAZO Value in next row Sensitive      <=4 SENSITIVEThis is a modified FDA-approved test that has been validated and its performance characteristics determined by the reporting laboratory.  This laboratory is certified under the Clinical Laboratory Improvement Amendments CLIA as qualified to perform high complexity clinical laboratory testing.    MEROPENEM Value in next row Sensitive      <=4 SENSITIVEThis is a modified FDA-approved test that has been validated and its performance characteristics determined by the reporting laboratory.  This laboratory is certified under the Clinical Laboratory Improvement Amendments CLIA as qualified to perform high complexity clinical laboratory testing.    * >=100,000 COLONIES/mL KLEBSIELLA PNEUMONIAE  Gastrointestinal Panel by PCR , Stool     Status: None   Collection Time: 12/30/24  3:16 AM   Specimen: STOOL  Result Value Ref Range Status   Campylobacter species NOT DETECTED NOT DETECTED Final   Plesimonas shigelloides NOT DETECTED NOT DETECTED Final   Salmonella species NOT DETECTED NOT DETECTED Final   Yersinia enterocolitica NOT DETECTED NOT DETECTED Final   Vibrio species NOT DETECTED NOT DETECTED Final   Vibrio cholerae NOT DETECTED NOT DETECTED Final   Enteroaggregative E coli (EAEC) NOT DETECTED NOT DETECTED Final   Enteropathogenic E coli (EPEC) NOT DETECTED NOT DETECTED Final   Enterotoxigenic E coli (ETEC) NOT DETECTED NOT DETECTED Final   Shiga like toxin producing E coli (STEC) NOT DETECTED NOT DETECTED Final   Shigella/Enteroinvasive E coli (EIEC) NOT DETECTED NOT DETECTED Final   Cryptosporidium NOT DETECTED NOT DETECTED Final   Cyclospora cayetanensis NOT DETECTED NOT DETECTED Final   Entamoeba histolytica NOT DETECTED NOT DETECTED Final   Giardia lamblia NOT DETECTED NOT DETECTED Final   Adenovirus F40/41 NOT DETECTED NOT  DETECTED Final   Astrovirus NOT DETECTED NOT DETECTED Final   Norovirus GI/GII NOT DETECTED NOT DETECTED Final   Rotavirus A NOT DETECTED NOT DETECTED Final   Sapovirus (I, II, IV, and V) NOT DETECTED NOT DETECTED Final    Comment: Performed at [facility], [address], [place], [place] [address]  Blood Culture (routine x 2)  Status: None (Preliminary result)   Collection Time: 12/30/24  1:49 PM   Specimen: BLOOD RIGHT HAND  Result Value Ref Range Status   Specimen Description   Final    BLOOD RIGHT HAND Performed at [facility], [address] [place], [place] [address]    Special Requests   Final    BOTTLES DRAWN AEROBIC AND ANAEROBIC Blood Culture adequate volume Performed at [facility], [address], [place], [place] [address]    Culture   Final    NO GROWTH 2 DAYS Performed at [facility], [address] [place], [place] [address]    Report Status PENDING  Incomplete         Radiology Studies: No results found.       Scheduled Meds:  enoxaparin  (LOVENOX ) injection  30 mg Subcutaneous Q24H   losartan   50 mg Oral Daily   pantoprazole   40 mg Oral Daily   rosuvastatin   10 mg Oral Daily   zolpidem   5 mg Oral QHS   Continuous Infusions:  cefTRIAXone  (ROCEPHIN )  IV 2 g (12/31/24 2106)     LOS: 2 days    Time spent: 45 minutes spent on 01/01/2025 caring for this patient face-to-face including chart review, ordering labs/tests, documenting, discussion with nursing staff, consultants, updating family and interview/physical exam    Camellia PARAS Mohan Erven, DO [facility] Available via Epic secure chat 7am-7pm After these hours, please refer to coverage provider listed on [email or web address] 01/01/2025, 12:46 PM   "

## 2025-01-02 ENCOUNTER — Other Ambulatory Visit (HOSPITAL_COMMUNITY): Payer: Self-pay

## 2025-01-02 DIAGNOSIS — R7881 Bacteremia: Secondary | ICD-10-CM | POA: Diagnosis not present

## 2025-01-02 DIAGNOSIS — B961 Klebsiella pneumoniae [K. pneumoniae] as the cause of diseases classified elsewhere: Secondary | ICD-10-CM | POA: Diagnosis not present

## 2025-01-02 LAB — CBC
HCT: 29.7 % — ABNORMAL LOW (ref 36.0–46.0)
Hemoglobin: 10 g/dL — ABNORMAL LOW (ref 12.0–15.0)
MCH: 30.3 pg (ref 26.0–34.0)
MCHC: 33.7 g/dL (ref 30.0–36.0)
MCV: 90 fL (ref 80.0–100.0)
Platelets: 187 K/uL (ref 150–400)
RBC: 3.3 MIL/uL — ABNORMAL LOW (ref 3.87–5.11)
RDW: 13.4 % (ref 11.5–15.5)
WBC: 10.9 K/uL — ABNORMAL HIGH (ref 4.0–10.5)
nRBC: 0 % (ref 0.0–0.2)

## 2025-01-02 LAB — CULTURE, BLOOD (ROUTINE X 2): Special Requests: ADEQUATE

## 2025-01-02 LAB — URINE CULTURE: Culture: >=100000 — AB

## 2025-01-02 MED ORDER — CIPROFLOXACIN HCL 500 MG PO TABS: 500.0000 mg | ORAL_TABLET | Freq: Two times a day (BID) | ORAL | 0 refills | Status: AC

## 2025-01-02 NOTE — Progress Notes (Signed)
 This RN provided discharge teaching to both pt and her spouse. Pt then brought down for discharge via wheelchair to private vehicle. Pt's breathing was regular, unlabored, and symmetrical. Pt did not appear to be in any apparent distress upon discharging.

## 2025-01-02 NOTE — Discharge Summary (Signed)
 " Physician Discharge Summary  Annette Hernandez FMW:[id number] DOB: 04-25-1941 DOA: 12/29/2024  PCP: Cleotilde Planas, MD  Admit date: 12/29/2024 Discharge date: 01/02/2025  Admitted From: Home Disposition: Home  Recommendations for Outpatient Follow-up:  Follow up with PCP in 1-2 weeks Follow-up with urology, Dr. Renda as scheduled for definitive stone management Continue ciprofloxacin  to complete 14-day antibiotic course for Klebsiella pneumonia bacteremia, Klebsiella/E. coli UTI  Home Health: No Equipment/Devices: None  Discharge Condition: Stable CODE STATUS: Full code Diet recommendation: Heart healthy diet  History of present illness:  Annette Hernandez is a 84 y.o. female with past medical history significant for HTN, HLD, GERD who presented to [facility] ED on 12/30/2023 from home via EMS with complaints of weakness, lethargy over the last 2 days.  Reports frequent urination.  Additionally patient with left forearm wound after fall over the last 2 weeks that is poorly healed.   In the ED, temperature 98.5 F, HR 107, RR 27, BP 120/56, SpO2 97% on room air.  WBC 40.0, hemoglobin 12.2, platelet count 247.  Sodium 134, potassium 3.7, chloride 99, CO2 22, glucose 111, BUN 36, creatinine 1.36.  AST 28, ALT 13, total bilirubin 0.7.  Lactic acid 1.8.  COVID/influenza/RSV PCR negative.  Urinalysis with large leukocytes, negative nitrite, many bacteria, greater than 50 WBCs.  Blood cultures x 2 and urine culture obtained.  CT head without contrast with no acute intracranial abnormality.  CT chest/abdomen/pelvis without contrast with ovoid calculus obstructing the left mid ureter measuring 8 x 4 x 6 mm with moderate to severe left hydronephrosis and perinephric fatty stranding, nonobstructive calculus lower pole left kidney measuring approximately 9 mm in diameter, ground glass and streaky opacities lower lobes bilaterally with tiny bilateral pleural effusions, mild/moderate calcific  coronary artery disease.  Patient started empiric antibiotics.  TRH consulted for admission for further evaluation management of sepsis likely secondary to UTI/pyelonephritis.  Hospital course:  Sepsis, POA Left pyelonephritis Klebsiella bacteremia Klebsiella/E. 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  Patient was afebrile, tachycardic, tachypneic with elevated WBC count of 40.0 at time of admission.  Urinalysis consistent with UTI.  CT head without contrast with no acute abnormality.  CT chest/abdomen/pelvis with findings of severe left hydronephrosis with obstructive calculus measuring 8 x 4 x 6 mm with perinephric stranding on the left consistent with pyelonephritis.  Patient was started empiric antibiotics, urology consulted and patient underwent cystoscopy with left ureteral stent placement by Dr. Renda on 12/30/2024.  Patient was continued on IV antibiotics with blood culture positive for Klebsiella pneumoniae, urine culture positive for Klebsiella and E. coli.  Patient will transition to ciprofloxacin  500 mg p.o. twice daily to complete 14-day total course.  Outpatient follow-up with urology for definitive stone management.   Acute renal failure Baseline creatinine 0.72.  Creatinine elevated on admission 1.36, likely secondary to sepsis/bacteremia, hydronephrosis from obstructive nephrolithiasis as above.  Patient was supportive IV fluid hydration with improvement of creatinine to 0.97 at time of discharge.   Left forearm wound Seen by wound RN. Cut piece of Aquacel and apply to left arm wound Q day, then cover with foam dressing. Change foam dressing Q 3 days or PRN. Moisten previous Aquacel with NS each time to assist with removal.    HTN Losartan  25 mg p.o. daily   HLD Crestor  10 mg p.o. daily   GERD Protonix  40 mg PO daily   Insomnia  Ambien  5 mg p.o.  nightly  Discharge Diagnoses:  Principal Problem:   Bacteremia due to Klebsiella pneumoniae Active Problems:   E. coli UTI   Sepsis (HCC)   Essential hypertension   Leukocytosis   HLD (hyperlipidemia)   ARF (acute renal failure)   Weakness   Pressure injury of skin   UTI due to Klebsiella species    Discharge Instructions  Discharge Instructions     Call MD for:  difficulty breathing, headache or visual disturbances   Complete by: As directed    Call MD for:  extreme fatigue   Complete by: As directed    Call MD for:  persistant dizziness or light-headedness   Complete by: As directed    Call MD for:  persistant nausea and vomiting   Complete by: As directed    Call MD for:  severe uncontrolled pain   Complete by: As directed    Call MD for:  temperature >100.4   Complete by: As directed    Discharge wound care:   Complete by: As directed    Cut piece of Aquacel and apply to left arm wound Q day, then cover with foam dressing.  Change foam dressing Q 3 days or PRN.  Moisten previous Aquacel with NS each time to assist with removal.   Increase activity slowly   Complete by: As directed       Allergies as of 01/02/2025       Reactions   Tape Other (See Comments)   SKIN TEARS AND BRUISES EASILY!!!!   Amoxicillin-pot Clavulanate Other (See Comments)   Reaction not recalled   Ciprofloxacin  Other (See Comments)   Reaction not recalled   Diphenhydramine Other (See Comments)   Reaction not recalled   Escitalopram Other (See Comments)   Reaction not recalled   Nitrofurantoin Other (See Comments)   Reaction not recalled        Medication List     STOP taking these medications    ondansetron  4 MG tablet Commonly known as: Zofran    oxyCODONE -acetaminophen  5-325 MG tablet Commonly known as: PERCOCET/ROXICET       TAKE these medications    Advil 200 MG Caps Generic drug: Ibuprofen Take 200 mg by mouth every 6 (six) hours as needed (for mild pain).    calcium  carbonate 500 MG chewable tablet Commonly known as: TUMS - dosed in mg elemental calcium  Chew 1 tablet by mouth daily as needed for indigestion or heartburn.   ciprofloxacin  500 MG tablet Commonly known as: Cipro  Take 1 tablet (500 mg total) by mouth 2 (two) times daily for 10 days.   losartan  100 MG tablet Commonly known as: COZAAR  Take 50 mg by mouth daily.   multivitamin with minerals Tabs tablet Take 1 tablet by mouth daily with breakfast.   NexIUM  24HR 20 MG capsule Generic drug: esomeprazole  Take 20 mg by mouth daily before breakfast.   rosuvastatin  10 MG tablet Commonly known as: CRESTOR  Take 10 mg by mouth daily.   zolpidem  10 MG tablet Commonly known as: AMBIEN  Take 5 mg by mouth at bedtime.               Discharge Care Instructions  (From admission, onward)           Start     Ordered   01/02/25 0000  Discharge wound care:       Comments: Cut piece of Aquacel and apply to left arm wound Q day, then cover with foam dressing.  Change foam dressing Q 3 days or PRN.  Moisten previous Aquacel with NS each time to assist with removal.   01/02/25 1158            Follow-up Information     Cleotilde Planas, MD. Schedule an appointment as soon as possible for a visit in 1 week(s).   Specialty: Family Medicine Contact information: [address] [place] [address] [phone]         Renda Glance, MD. Schedule an appointment as soon as possible for a visit.   Specialty: Urology Contact information: [address] [place] [address] [phone]                Allergies[1]  Consultations: Urology, Dr. Renda   Procedures/Studies: DG C-Arm 1-60 Min-No Report Result Date: 12/30/2024 Fluoroscopy was utilized by the requesting physician.  No radiographic interpretation.   CT HEAD WO CONTRAST ( ) Result Date: 12/30/2024 EXAM: CT HEAD WITHOUT CONTRAST 12/30/2024 07:15:00 AM TECHNIQUE: CT of the head was performed  without the administration of intravenous contrast. Automated exposure control, iterative reconstruction, and/or weight based adjustment of the mA/kV was utilized to reduce the radiation dose to as low as reasonably achievable. COMPARISON: 02/14/2021. CLINICAL HISTORY: Mental status change, unknown cause. The patient presents with a mental status change of unknown cause. FINDINGS: BRAIN AND VENTRICLES: No acute hemorrhage. No evidence of acute infarct. No hydrocephalus. No extra-axial collection. No mass effect or midline shift. ORBITS: No acute abnormality. SINUSES: No acute abnormality. SOFT TISSUES AND SKULL: No acute soft tissue abnormality. No skull fracture. IMPRESSION: 1. No acute intracranial abnormality. Electronically signed by: Waddell Calk MD 12/30/2024 07:33 AM EST RP Workstation: [id number]   CT CHEST ABDOMEN PELVIS WO CONTRAST Result Date: 12/30/2024 EXAM: CT CHEST, ABDOMEN AND PELVIS WITHOUT CONTRAST 12/30/2024 06:18:23 AM TECHNIQUE: CT of the chest, abdomen and pelvis was performed without the administration of intravenous contrast. Multiplanar reformatted images are provided for review. Automated exposure control, iterative reconstruction, and/or weight based adjustment of the mA/kV was utilized to reduce the radiation dose to as low as reasonably achievable. COMPARISON: CT of the abdomen and pelvis dated 06/22/2015. CLINICAL HISTORY: Sepsis. FINDINGS: CHEST: MEDIASTINUM AND LYMPH NODES: Heart demonstrates mild-to-moderate calcific coronary artery disease. Pericardium is unremarkable. The central airways are clear. No mediastinal, hilar or axillary lymphadenopathy. LUNGS AND PLEURA: Ground-glass and streaky opacities present independently within the lower lobes bilaterally. The nondependent lungs are clear. Tiny bilateral pleural effusions. No pneumothorax. ABDOMEN AND PELVIS: LIVER: Unremarkable. GALLBLADDER AND BILE DUCTS: Numerous calcified stones lying dependently within the gallbladder. No  definite evidence of cholecystitis. No biliary ductal dilatation. SPLEEN: No acute abnormality. PANCREAS: No acute abnormality. ADRENAL GLANDS: No acute abnormality. KIDNEYS, URETERS AND BLADDER: Left kidney: Ovoid calculus obstructing the left mid ureter measuring approximately 8 x 4 x 6 mm. Moderate-to-severe left hydronephrosis and perinephric fatty stranding. Nonobstructive calculus in the lower pole of the left kidney, measuring approximately 9 mm in diameter. Right kidney: Couple of calculi present within the lower pole of the right kidney, with the larger calculus measuring 3 mm in diameter. No evidence of obstructive uropathy on the right. Urinary bladder is unremarkable. GI AND BOWEL: Stomach demonstrates no acute abnormality. There is no bowel obstruction. REPRODUCTIVE ORGANS: Status post hysterectomy and bilateral salpingo-oophorectomy. PERITONEUM AND RETROPERITONEUM: No ascites. No free air. VASCULATURE: Abdominal aorta is normal in caliber and demonstrates moderate calcific atheromatous disease. Thoracic aorta demonstrates mild calcific atheromatous disease. ABDOMINAL AND PELVIS LYMPH NODES: No lymphadenopathy. BONES AND  SOFT TISSUES: Moderate dextroscoliosis of the thoracolumbar spine. Moderate diffuse chronic degenerative disc disease present throughout it. Status post left total hip arthroplasty. No focal soft tissue abnormality. IMPRESSION: 1. Ovoid calculus obstructing the left mid ureter measuring approximately 8 x 4 x 6 mm, with moderate-to-severe left hydronephrosis and perinephric fatty stranding. 2. Nonobstructive calculus in the lower pole of the left kidney, measuring approximately 9 mm in diameter. 3. Couple of calculi in the lower pole of the right kidney, with the larger calculus measuring 3 mm in diameter. No evidence of obstructive uropathy on the right. 4. Numerous calcified stones lying dependently within the gallbladder. No definite evidence of cholecystitis. 5. Ground-glass and  streaky opacities in the lower lobes bilaterally with tiny bilateral pleural effusions. 6. Mild-to-moderate calcific coronary artery disease. Electronically signed by: Evalene Coho MD 12/30/2024 06:40 AM EST RP Workstation: [id number]   DG Chest Port 1 View Result Date: 12/29/2024 CLINICAL DATA:  Questionable sepsis. EXAM: PORTABLE CHEST 1 VIEW COMPARISON:  Chest radiograph dated 12/16/2020. FINDINGS: No focal consolidation, pleural effusion, pneumothorax. The cardiac silhouette is within normal limits. Degenerative changes of the spine and scoliosis. No acute osseous pathology. IMPRESSION: No active disease. Electronically Signed   By: Vanetta Chou M.D.   On: 12/29/2024 18:47     Subjective: Patient seen examined bedside, lying in bed.  Spouse present.  Ready for discharge home.  Urine culture susceptibilities now returned.  Will continue antibiotics with Cipro  to complete 14-day total course.  Has upcoming appointment scheduled with Dr. Renda for discussion of definitive stone management.  No other questions or concerns at this time.  Denies headache, no dizziness, no chest pain, no palpitations, no shortness of breath, no abdominal pain, no fever/chills/night sweats, no nausea/vomit/diarrhea, no focal weakness, no fatigue, no paresthesia.  No acute events overnight per nurse staff.  Discharge Exam: Vitals:   01/02/25 0526 01/02/25 0527  BP: (!) 182/86 (!) 168/75  Pulse: 68 67  Resp: 18   Temp: 98.6 F (37 C)   SpO2: 96% 92%   Vitals:   01/01/25 1355 01/01/25 2102 01/02/25 0526 01/02/25 0527  BP: (!) 159/79 (!) 147/80 (!) 182/86 (!) 168/75  Pulse: 68 78 68 67  Resp: 18 19 18    Temp: 98 F (36.7 C) 98.1 F (36.7 C) 98.6 F (37 C)   TempSrc: Oral Oral Oral   SpO2: 95% 94% 96% 92%  Weight:      Height:        Physical Exam: GEN: NAD, alert and oriented x 3, elderly appearance HEENT: NCAT, PERRL, EOMI, sclera clear, MMM PULM: CTAB w/o wheezes/crackles, normal respiratory  effort, room air CV: RRR w/o M/G/R GI: abd soft, NTND, bowel sounds present MSK: no peripheral edema, moves all extremities independently with preserved muscle strength NEURO: No focal neurological deficit PSYCH: normal mood/affect Integumentary: Left forearm/elbow wound as depicted below, otherwise no concerning rashes/lesions/wounds nonexposed skin surfaces    The results of significant diagnostics from this hospitalization (including imaging, microbiology, ancillary and laboratory) are listed below for reference.     Microbiology: Recent Results (from the past 240 hours)  Blood Culture (routine x 2)     Status: Abnormal   Collection Time: 12/29/24  6:45 PM   Specimen: BLOOD  Result Value Ref Range Status   Specimen Description   Final    BLOOD RIGHT ANTECUBITAL Performed at [facility], [address], [place], [place] [address]    Special Requests   Final  BOTTLES DRAWN AEROBIC AND ANAEROBIC Blood Culture adequate volume Performed at [facility], [address], [place], [place] [address]    Culture  Setup Time   Final    GRAM NEGATIVE RODS IN BOTH AEROBIC AND ANAEROBIC BOTTLES CRITICAL RESULT CALLED TO, READ BACK BY AND VERIFIED WITH: PHARMD MARY  S [id number] FCP Performed at [facility], [address], [place], [place] [address]    Culture KLEBSIELLA PNEUMONIAE (A)  Final   Report Status 01/02/2025 FINAL  Final   Organism ID, Bacteria KLEBSIELLA PNEUMONIAE  Final      Susceptibility   Klebsiella pneumoniae - MIC*    AMPICILLIN RESISTANT Resistant     CEFAZOLIN (NON-URINE) 2 SENSITIVE Sensitive     CEFEPIME <=0.12 SENSITIVE Sensitive     ERTAPENEM <=0.12 SENSITIVE Sensitive     CEFTRIAXONE  <=0.25 SENSITIVE Sensitive     CIPROFLOXACIN  <=0.06 SENSITIVE Sensitive     GENTAMICIN <=1 SENSITIVE Sensitive     MEROPENEM <=0.25 SENSITIVE Sensitive     TRIMETH/SULFA <=20 SENSITIVE Sensitive     AMPICILLIN/SULBACTAM 4  SENSITIVE Sensitive     PIP/TAZO Value in next row Sensitive      <=4 SENSITIVEThis is a modified FDA-approved test that has been validated and its performance characteristics determined by the reporting laboratory.  This laboratory is certified under the Clinical Laboratory Improvement Amendments CLIA as qualified to perform high complexity clinical laboratory testing.    * KLEBSIELLA PNEUMONIAE  Resp panel by RT-PCR (RSV, Flu A&B, Covid) Anterior Nasal Swab     Status: None   Collection Time: 12/29/24  6:45 PM   Specimen: Anterior Nasal Swab  Result Value Ref Range Status   SARS Coronavirus 2 by RT PCR NEGATIVE NEGATIVE Final    Comment: (NOTE) SARS-CoV-2 target nucleic acids are NOT DETECTED.  The SARS-CoV-2 RNA is generally detectable in upper respiratory specimens during the acute phase of infection. The lowest concentration of SARS-CoV-2 viral copies this assay can detect is 138 copies/mL. A negative result does not preclude SARS-Cov-2 infection and should not be used as the sole basis for treatment or other patient management decisions. A negative result may occur with  improper specimen collection/handling, submission of specimen other than nasopharyngeal swab, presence of viral mutation(s) within the areas targeted by this assay, and inadequate number of viral copies(<138 copies/mL). A negative result must be combined with clinical observations, patient history, and epidemiological information. The expected result is Negative.  Fact Sheet for Patients:  bloggercourse.com  Fact Sheet for Healthcare Providers:  seriousbroker.it  This test is no t yet approved or cleared by the [place]  FDA and  has been authorized for detection and/or diagnosis of SARS-CoV-2 by FDA under an Emergency Use Authorization (EUA). This EUA will remain  in effect (meaning this test can be used) for the duration of the COVID-19 declaration under  Section 564(b)(1) of the Act, 21 U.S.C.section 360bbb-3(b)(1), unless the authorization is terminated  or revoked sooner.       Influenza A by PCR NEGATIVE NEGATIVE Final   Influenza B by PCR NEGATIVE NEGATIVE Final    Comment: (NOTE) The Xpert Xpress SARS-CoV-2/FLU/RSV plus assay is intended as an aid in the diagnosis of influenza from Nasopharyngeal swab specimens and should not be used as a sole basis for treatment. Nasal washings and aspirates are unacceptable for Xpert Xpress SARS-CoV-2/FLU/RSV testing.  Fact Sheet for Patients: bloggercourse.com  Fact Sheet for Healthcare Providers: seriousbroker.it  This test is not yet  approved or cleared by the [place]  FDA and has been authorized for detection and/or diagnosis of SARS-CoV-2 by FDA under an Emergency Use Authorization (EUA). This EUA will remain in effect (meaning this test can be used) for the duration of the COVID-19 declaration under Section 564(b)(1) of the Act, 21 U.S.C. section 360bbb-3(b)(1), unless the authorization is terminated or revoked.     Resp Syncytial Virus by PCR NEGATIVE NEGATIVE Final    Comment: (NOTE) Fact Sheet for Patients: bloggercourse.com  Fact Sheet for Healthcare Providers: seriousbroker.it  This test is not yet approved or cleared by the [place]  FDA and has been authorized for detection and/or diagnosis of SARS-CoV-2 by FDA under an Emergency Use Authorization (EUA). This EUA will remain in effect (meaning this test can be used) for the duration of the COVID-19 declaration under Section 564(b)(1) of the Act, 21 U.S.C. section 360bbb-3(b)(1), unless the authorization is terminated or revoked.  Performed at [facility], [address], [place], [place] [address]   Blood Culture ID Panel (Reflexed)     Status: Abnormal   Collection Time: 12/29/24  6:45 PM   Result Value Ref Range Status   Enterococcus faecalis NOT DETECTED NOT DETECTED Final   Enterococcus Faecium NOT DETECTED NOT DETECTED Final   Listeria monocytogenes NOT DETECTED NOT DETECTED Final   Staphylococcus species NOT DETECTED NOT DETECTED Final   Staphylococcus aureus (BCID) NOT DETECTED NOT DETECTED Final   Staphylococcus epidermidis NOT DETECTED NOT DETECTED Final   Staphylococcus lugdunensis NOT DETECTED NOT DETECTED Final   Streptococcus species NOT DETECTED NOT DETECTED Final   Streptococcus agalactiae NOT DETECTED NOT DETECTED Final   Streptococcus pneumoniae NOT DETECTED NOT DETECTED Final   Streptococcus pyogenes NOT DETECTED NOT DETECTED Final   A.calcoaceticus-baumannii NOT DETECTED NOT DETECTED Final   Bacteroides fragilis NOT DETECTED NOT DETECTED Final   Enterobacterales DETECTED (A) NOT DETECTED Final    Comment: Enterobacterales represent a large order of gram negative bacteria, not a single organism. CRITICAL RESULT CALLED TO, READ BACK BY AND VERIFIED WITH: PHARMD MARY  S [id number] FCP    Enterobacter cloacae complex NOT DETECTED NOT DETECTED Final   Escherichia coli NOT DETECTED NOT DETECTED Final   Klebsiella aerogenes NOT DETECTED NOT DETECTED Final   Klebsiella oxytoca NOT DETECTED NOT DETECTED Final   Klebsiella pneumoniae DETECTED (A) NOT DETECTED Final    Comment: CRITICAL RESULT CALLED TO, READ BACK BY AND VERIFIED WITH: PHARMD MARY  S [id number] FCP    Proteus species NOT DETECTED NOT DETECTED Final   Salmonella species NOT DETECTED NOT DETECTED Final   Serratia marcescens NOT DETECTED NOT DETECTED Final   Haemophilus influenzae NOT DETECTED NOT DETECTED Final   Neisseria meningitidis NOT DETECTED NOT DETECTED Final   Pseudomonas aeruginosa NOT DETECTED NOT DETECTED Final   Stenotrophomonas maltophilia NOT DETECTED NOT DETECTED Final   Candida albicans NOT DETECTED NOT DETECTED Final   Candida auris NOT DETECTED NOT DETECTED Final   Candida  glabrata NOT DETECTED NOT DETECTED Final   Candida krusei NOT DETECTED NOT DETECTED Final   Candida parapsilosis NOT DETECTED NOT DETECTED Final   Candida tropicalis NOT DETECTED NOT DETECTED Final   Cryptococcus neoformans/gattii NOT DETECTED NOT DETECTED Final   CTX-M ESBL NOT DETECTED NOT DETECTED Final   Carbapenem resistance IMP NOT DETECTED NOT DETECTED Final   Carbapenem resistance KPC NOT DETECTED NOT DETECTED Final   Carbapenem resistance NDM NOT DETECTED NOT DETECTED Final   Carbapenem resist OXA  48 LIKE NOT DETECTED NOT DETECTED Final   Carbapenem resistance VIM NOT DETECTED NOT DETECTED Final    Comment: Performed at [facility], [address], [place], [place] [address]  Urine Culture     Status: Abnormal   Collection Time: 12/29/24 11:31 PM   Specimen: Urine, Clean Catch  Result Value Ref Range Status   Specimen Description   Final    URINE, CLEAN CATCH Performed at [facility], [address] [place], [place] [address]    Special Requests   Final    NONE Performed at [facility], [address], [place], [place] [address]    Culture (A)  Final    >=100,000 COLONIES/mL KLEBSIELLA PNEUMONIAE >=100,000 COLONIES/mL ESCHERICHIA COLI    Report Status 01/02/2025 FINAL  Final   Organism ID, Bacteria KLEBSIELLA PNEUMONIAE (A)  Final   Organism ID, Bacteria ESCHERICHIA COLI (A)  Final      Susceptibility   Escherichia coli - MIC*    AMPICILLIN <=2 SENSITIVE Sensitive     CEFAZOLIN (URINE) Value in next row Sensitive      <=1 SENSITIVEThis is a modified FDA-approved test that has been validated and its performance characteristics determined by the reporting laboratory.  This laboratory is certified under the Clinical Laboratory Improvement Amendments CLIA as qualified to perform high complexity clinical laboratory testing.    CEFEPIME Value in next row Sensitive      <=1 SENSITIVEThis is a modified FDA-approved test that has  been validated and its performance characteristics determined by the reporting laboratory.  This laboratory is certified under the Clinical Laboratory Improvement Amendments CLIA as qualified to perform high complexity clinical laboratory testing.    ERTAPENEM Value in next row Sensitive      <=1 SENSITIVEThis is a modified FDA-approved test that has been validated and its performance characteristics determined by the reporting laboratory.  This laboratory is certified under the Clinical Laboratory Improvement Amendments CLIA as qualified to perform high complexity clinical laboratory testing.    CEFTRIAXONE  Value in next row Sensitive      <=1 SENSITIVEThis is a modified FDA-approved test that has been validated and its performance characteristics determined by the reporting laboratory.  This laboratory is certified under the Clinical Laboratory Improvement Amendments CLIA as qualified to perform high complexity clinical laboratory testing.    CIPROFLOXACIN  Value in next row Sensitive      <=1 SENSITIVEThis is a modified FDA-approved test that has been validated and its performance characteristics determined by the reporting laboratory.  This laboratory is certified under the Clinical Laboratory Improvement Amendments CLIA as qualified to perform high complexity clinical laboratory testing.    GENTAMICIN Value in next row Sensitive      <=1 SENSITIVEThis is a modified FDA-approved test that has been validated and its performance characteristics determined by the reporting laboratory.  This laboratory is certified under the Clinical Laboratory Improvement Amendments CLIA as qualified to perform high complexity clinical laboratory testing.    NITROFURANTOIN Value in next row Sensitive      <=1 SENSITIVEThis is a modified FDA-approved test that has been validated and its performance characteristics determined by the reporting laboratory.  This laboratory is certified under the Clinical Laboratory Improvement  Amendments CLIA as qualified to perform high complexity clinical laboratory testing.    TRIMETH/SULFA Value in next row Sensitive      <=1 SENSITIVEThis is a modified FDA-approved test that has been validated and its performance characteristics determined by the  reporting laboratory.  This laboratory is certified under the Clinical Laboratory Improvement Amendments CLIA as qualified to perform high complexity clinical laboratory testing.    AMPICILLIN/SULBACTAM Value in next row Sensitive      <=1 SENSITIVEThis is a modified FDA-approved test that has been validated and its performance characteristics determined by the reporting laboratory.  This laboratory is certified under the Clinical Laboratory Improvement Amendments CLIA as qualified to perform high complexity clinical laboratory testing.    PIP/TAZO Value in next row Sensitive      <=4 SENSITIVEThis is a modified FDA-approved test that has been validated and its performance characteristics determined by the reporting laboratory.  This laboratory is certified under the Clinical Laboratory Improvement Amendments CLIA as qualified to perform high complexity clinical laboratory testing.    MEROPENEM Value in next row Sensitive      <=4 SENSITIVEThis is a modified FDA-approved test that has been validated and its performance characteristics determined by the reporting laboratory.  This laboratory is certified under the Clinical Laboratory Improvement Amendments CLIA as qualified to perform high complexity clinical laboratory testing.    * >=100,000 COLONIES/mL ESCHERICHIA COLI   Klebsiella pneumoniae - MIC*    AMPICILLIN Value in next row Resistant      <=4 SENSITIVEThis is a modified FDA-approved test that has been validated and its performance characteristics determined by the reporting laboratory.  This laboratory is certified under the Clinical Laboratory Improvement Amendments CLIA as qualified to perform high complexity clinical laboratory  testing.    CEFAZOLIN (URINE) Value in next row Sensitive      2 SENSITIVEThis is a modified FDA-approved test that has been validated and its performance characteristics determined by the reporting laboratory.  This laboratory is certified under the Clinical Laboratory Improvement Amendments CLIA as qualified to perform high complexity clinical laboratory testing.    CEFEPIME Value in next row Sensitive      2 SENSITIVEThis is a modified FDA-approved test that has been validated and its performance characteristics determined by the reporting laboratory.  This laboratory is certified under the Clinical Laboratory Improvement Amendments CLIA as qualified to perform high complexity clinical laboratory testing.    ERTAPENEM Value in next row Sensitive      2 SENSITIVEThis is a modified FDA-approved test that has been validated and its performance characteristics determined by the reporting laboratory.  This laboratory is certified under the Clinical Laboratory Improvement Amendments CLIA as qualified to perform high complexity clinical laboratory testing.    CEFTRIAXONE  Value in next row Sensitive      2 SENSITIVEThis is a modified FDA-approved test that has been validated and its performance characteristics determined by the reporting laboratory.  This laboratory is certified under the Clinical Laboratory Improvement Amendments CLIA as qualified to perform high complexity clinical laboratory testing.    CIPROFLOXACIN  Value in next row Sensitive      2 SENSITIVEThis is a modified FDA-approved test that has been validated and its performance characteristics determined by the reporting laboratory.  This laboratory is certified under the Clinical Laboratory Improvement Amendments CLIA as qualified to perform high complexity clinical laboratory testing.    GENTAMICIN Value in next row Sensitive      2 SENSITIVEThis is a modified FDA-approved test that has been validated and its performance characteristics  determined by the reporting laboratory.  This laboratory is certified under the Clinical Laboratory Improvement Amendments CLIA as qualified to perform high complexity clinical laboratory testing.    NITROFURANTOIN Value in next row Intermediate  2 SENSITIVEThis is a modified FDA-approved test that has been validated and its performance characteristics determined by the reporting laboratory.  This laboratory is certified under the Clinical Laboratory Improvement Amendments CLIA as qualified to perform high complexity clinical laboratory testing.    TRIMETH/SULFA Value in next row Sensitive      2 SENSITIVEThis is a modified FDA-approved test that has been validated and its performance characteristics determined by the reporting laboratory.  This laboratory is certified under the Clinical Laboratory Improvement Amendments CLIA as qualified to perform high complexity clinical laboratory testing.    AMPICILLIN/SULBACTAM Value in next row Sensitive      2 SENSITIVEThis is a modified FDA-approved test that has been validated and its performance characteristics determined by the reporting laboratory.  This laboratory is certified under the Clinical Laboratory Improvement Amendments CLIA as qualified to perform high complexity clinical laboratory testing.    PIP/TAZO Value in next row Sensitive      <=4 SENSITIVEThis is a modified FDA-approved test that has been validated and its performance characteristics determined by the reporting laboratory.  This laboratory is certified under the Clinical Laboratory Improvement Amendments CLIA as qualified to perform high complexity clinical laboratory testing.    MEROPENEM Value in next row Sensitive      <=4 SENSITIVEThis is a modified FDA-approved test that has been validated and its performance characteristics determined by the reporting laboratory.  This laboratory is certified under the Clinical Laboratory Improvement Amendments CLIA as qualified to perform high  complexity clinical laboratory testing.    * >=100,000 COLONIES/mL KLEBSIELLA PNEUMONIAE  Gastrointestinal Panel by PCR , Stool     Status: None   Collection Time: 12/30/24  3:16 AM   Specimen: STOOL  Result Value Ref Range Status   Campylobacter species NOT DETECTED NOT DETECTED Final   Plesimonas shigelloides NOT DETECTED NOT DETECTED Final   Salmonella species NOT DETECTED NOT DETECTED Final   Yersinia enterocolitica NOT DETECTED NOT DETECTED Final   Vibrio species NOT DETECTED NOT DETECTED Final   Vibrio cholerae NOT DETECTED NOT DETECTED Final   Enteroaggregative E coli (EAEC) NOT DETECTED NOT DETECTED Final   Enteropathogenic E coli (EPEC) NOT DETECTED NOT DETECTED Final   Enterotoxigenic E coli (ETEC) NOT DETECTED NOT DETECTED Final   Shiga like toxin producing E coli (STEC) NOT DETECTED NOT DETECTED Final   Shigella/Enteroinvasive E coli (EIEC) NOT DETECTED NOT DETECTED Final   Cryptosporidium NOT DETECTED NOT DETECTED Final   Cyclospora cayetanensis NOT DETECTED NOT DETECTED Final   Entamoeba histolytica NOT DETECTED NOT DETECTED Final   Giardia lamblia NOT DETECTED NOT DETECTED Final   Adenovirus F40/41 NOT DETECTED NOT DETECTED Final   Astrovirus NOT DETECTED NOT DETECTED Final   Norovirus GI/GII NOT DETECTED NOT DETECTED Final   Rotavirus A NOT DETECTED NOT DETECTED Final   Sapovirus (I, II, IV, and V) NOT DETECTED NOT DETECTED Final    Comment: Performed at [facility], [address] [place], [place] [address]  Blood Culture (routine x 2)     Status: None (Preliminary result)   Collection Time: 12/30/24  1:49 PM   Specimen: BLOOD RIGHT HAND  Result Value Ref Range Status   Specimen Description   Final    BLOOD RIGHT HAND Performed at [facility], [address] [place], [place] [address]    Special Requests   Final    BOTTLES DRAWN AEROBIC AND ANAEROBIC Blood Culture adequate volume Performed at [facility], [address] [place], [place] [address]    Culture   Final    NO GROWTH 3 DAYS Performed at [facility], [address], [place], [place] [address]    Report Status PENDING  Incomplete     Labs: BNP (last 3 results) No results for input(s): BNP in the last 8760 hours. Basic Metabolic Panel: Recent Labs  Lab 12/29/24 1845 12/30/24 0440 12/31/24 0424  NA 134* 137 135  K 3.7 3.9 4.2  CL 99 105 107  CO2 22 21* 20*  GLUCOSE 111* 126* 127*  BUN 36* 34* 34*  CREATININE 1.36* 1.25* 0.97  CALCIUM  9.3 8.5* 8.4*   Liver Function Tests: Recent Labs  Lab 12/29/24 1845 12/30/24 0440  AST 28 27  ALT 13 13  ALKPHOS 108 90  BILITOT 0.7 0.3  PROT 6.8 5.6*  ALBUMIN 3.9 3.2*   No results for input(s): LIPASE, AMYLASE in the last 168 hours. No results for input(s): AMMONIA in the last 168 hours. CBC: Recent Labs  Lab 12/29/24 1845 12/30/24 0316 12/30/24 0440 12/31/24 0424 01/01/25 0414 01/02/25 0354  WBC 40.0* 38.5* 36.6* 23.9* 23.4* 10.9*  NEUTROABS 36.2*  --  33.8*  --   --   --   HGB 12.2 10.8* 10.4* 9.3* 9.7* 10.0*  HCT 36.2 32.2* 31.4* 27.8* 28.6* 29.7*  MCV 91.2 91.5 92.6 91.1 89.9 90.0  PLT 247 198 182 156 165 187   Cardiac Enzymes: Recent Labs  Lab 12/30/24 0440  CKTOTAL 206   BNP: Invalid input(s): POCBNP CBG: Recent Labs  Lab 12/30/24 2135 12/31/24 0731 12/31/24 1106  GLUCAP 136* 125* 117*   D-Dimer No results for input(s): DDIMER in the last 72 hours. Hgb A1c No results for input(s): HGBA1C in the last 72 hours. Lipid Profile No results for input(s): CHOL, HDL, LDLCALC, TRIG, CHOLHDL, LDLDIRECT in the last 72 hours. Thyroid function studies No results for input(s): TSH, T4TOTAL, T3FREE, THYROIDAB in the last 72 hours.  Invalid input(s): FREET3 Anemia work up No results for input(s): VITAMINB12, FOLATE, FERRITIN, TIBC, IRON, RETICCTPCT in the last 72 hours. Urinalysis    Component Value Date/Time    COLORURINE YELLOW 12/29/2024 2024   APPEARANCEUR CLOUDY (A) 12/29/2024 2024   LABSPEC 1.014 12/29/2024 2024   PHURINE 5.0 12/29/2024 2024   GLUCOSEU NEGATIVE 12/29/2024 2024   HGBUR MODERATE (A) 12/29/2024 2024   BILIRUBINUR NEGATIVE 12/29/2024 2024   KETONESUR NEGATIVE 12/29/2024 2024   PROTEINUR 100 (A) 12/29/2024 2024   UROBILINOGEN 0.2 06/29/2011 0901   NITRITE NEGATIVE 12/29/2024 2024   LEUKOCYTESUR LARGE (A) 12/29/2024 2024   Sepsis Labs Recent Labs  Lab 12/30/24 0440 12/31/24 0424 01/01/25 0414 01/02/25 0354  WBC 36.6* 23.9* 23.4* 10.9*   Microbiology Recent Results (from the past 240 hours)  Blood Culture (routine x 2)     Status: Abnormal   Collection Time: 12/29/24  6:45 PM   Specimen: BLOOD  Result Value Ref Range Status   Specimen Description   Final    BLOOD RIGHT ANTECUBITAL Performed at [facility], [address] [place], [place] [address]    Special Requests   Final    BOTTLES DRAWN AEROBIC AND ANAEROBIC Blood Culture adequate volume Performed at [facility], [address] [place], [place] [address]    Culture  Setup Time   Final    GRAM NEGATIVE RODS IN BOTH AEROBIC AND ANAEROBIC BOTTLES CRITICAL RESULT CALLED TO, READ BACK BY AND VERIFIED WITH: [employer] MARY  S [phone] [id number] FCP Performed at [facility], [address], [place], [place] [address]    Culture KLEBSIELLA PNEUMONIAE (A)  Final   Report Status 01/02/2025 FINAL  Final   Organism ID, Bacteria KLEBSIELLA PNEUMONIAE  Final      Susceptibility   Klebsiella pneumoniae - MIC*    AMPICILLIN RESISTANT Resistant     CEFAZOLIN (NON-URINE) 2 SENSITIVE Sensitive     CEFEPIME <=0.12 SENSITIVE Sensitive     ERTAPENEM <=0.12 SENSITIVE Sensitive     CEFTRIAXONE  <=0.25 SENSITIVE Sensitive     CIPROFLOXACIN  <=0.06 SENSITIVE Sensitive     GENTAMICIN <=1 SENSITIVE Sensitive     MEROPENEM <=0.25 SENSITIVE Sensitive     TRIMETH/SULFA <=20 SENSITIVE Sensitive      AMPICILLIN/SULBACTAM 4 SENSITIVE Sensitive     PIP/TAZO Value in next row Sensitive      <=4 SENSITIVEThis is a modified FDA-approved test that has been validated and its performance characteristics determined by the reporting laboratory.  This laboratory is certified under the Clinical Laboratory Improvement Amendments CLIA as qualified to perform high complexity clinical laboratory testing.    * KLEBSIELLA PNEUMONIAE  Resp panel by RT-PCR (RSV, Flu A&B, Covid) Anterior Nasal Swab     Status: None   Collection Time: 12/29/24  6:45 PM   Specimen: Anterior Nasal Swab  Result Value Ref Range Status   SARS Coronavirus 2 by RT PCR NEGATIVE NEGATIVE Final    Comment: (NOTE) SARS-CoV-2 target nucleic acids are NOT DETECTED.  The SARS-CoV-2 RNA is generally detectable in upper respiratory specimens during the acute phase of infection. The lowest concentration of SARS-CoV-2 viral copies this assay can detect is 138 copies/mL. A negative result does not preclude SARS-Cov-2 infection and should not be used as the sole basis for treatment or other patient management decisions. A negative result may occur with  improper specimen collection/handling, submission of specimen other than nasopharyngeal swab, presence of viral mutation(s) within the areas targeted by this assay, and inadequate number of viral copies(<138 copies/mL). A negative result must be combined with clinical observations, patient history, and epidemiological information. The expected result is Negative.  Fact Sheet for Patients:  bloggercourse.com  Fact Sheet for Healthcare Providers:  seriousbroker.it  This test is no t yet approved or cleared by the [place]  FDA and  has been authorized for detection and/or diagnosis of SARS-CoV-2 by FDA under an Emergency Use Authorization (EUA). This EUA will remain  in effect (meaning this test can be used) for the duration of  the COVID-19 declaration under Section 564(b)(1) of the Act, 21 U.S.C.section 360bbb-3(b)(1), unless the authorization is terminated  or revoked sooner.       Influenza A by PCR NEGATIVE NEGATIVE Final   Influenza B by PCR NEGATIVE NEGATIVE Final    Comment: (NOTE) The Xpert Xpress SARS-CoV-2/FLU/RSV plus assay is intended as an aid in the diagnosis of influenza from Nasopharyngeal swab specimens and should not be used as a sole basis for treatment. Nasal washings and aspirates are unacceptable for Xpert Xpress SARS-CoV-2/FLU/RSV testing.  Fact Sheet for Patients: bloggercourse.com  Fact Sheet for Healthcare Providers: seriousbroker.it  This test is not yet approved or cleared by the [place]  FDA and has been authorized for detection and/or diagnosis of SARS-CoV-2 by FDA under an Emergency Use Authorization (EUA). This EUA will remain in effect (meaning this test can be used) for the duration of the COVID-19 declaration under Section 564(b)(1) of the Act, 21 U.S.C. section 360bbb-3(b)(1), unless the  authorization is terminated or revoked.     Resp Syncytial Virus by PCR NEGATIVE NEGATIVE Final    Comment: (NOTE) Fact Sheet for Patients: bloggercourse.com  Fact Sheet for Healthcare Providers: seriousbroker.it  This test is not yet approved or cleared by the [place]  FDA and has been authorized for detection and/or diagnosis of SARS-CoV-2 by FDA under an Emergency Use Authorization (EUA). This EUA will remain in effect (meaning this test can be used) for the duration of the COVID-19 declaration under Section 564(b)(1) of the Act, 21 U.S.C. section 360bbb-3(b)(1), unless the authorization is terminated or revoked.  Performed at [facility], [address], [place], [place] [address]   Blood Culture ID Panel (Reflexed)     Status: Abnormal    Collection Time: 12/29/24  6:45 PM  Result Value Ref Range Status   Enterococcus faecalis NOT DETECTED NOT DETECTED Final   Enterococcus Faecium NOT DETECTED NOT DETECTED Final   Listeria monocytogenes NOT DETECTED NOT DETECTED Final   Staphylococcus species NOT DETECTED NOT DETECTED Final   Staphylococcus aureus (BCID) NOT DETECTED NOT DETECTED Final   Staphylococcus epidermidis NOT DETECTED NOT DETECTED Final   Staphylococcus lugdunensis NOT DETECTED NOT DETECTED Final   Streptococcus species NOT DETECTED NOT DETECTED Final   Streptococcus agalactiae NOT DETECTED NOT DETECTED Final   Streptococcus pneumoniae NOT DETECTED NOT DETECTED Final   Streptococcus pyogenes NOT DETECTED NOT DETECTED Final   A.calcoaceticus-baumannii NOT DETECTED NOT DETECTED Final   Bacteroides fragilis NOT DETECTED NOT DETECTED Final   Enterobacterales DETECTED (A) NOT DETECTED Final    Comment: Enterobacterales represent a large order of gram negative bacteria, not a single organism. CRITICAL RESULT CALLED TO, READ BACK BY AND VERIFIED WITH: PHARMD MARY  S [phone] [id number] FCP    Enterobacter cloacae complex NOT DETECTED NOT DETECTED Final   Escherichia coli NOT DETECTED NOT DETECTED Final   Klebsiella aerogenes NOT DETECTED NOT DETECTED Final   Klebsiella oxytoca NOT DETECTED NOT DETECTED Final   Klebsiella pneumoniae DETECTED (A) NOT DETECTED Final    Comment: CRITICAL RESULT CALLED TO, READ BACK BY AND VERIFIED WITH: PHARMD MARY  S [phone] [id number] FCP    Proteus species NOT DETECTED NOT DETECTED Final   Salmonella species NOT DETECTED NOT DETECTED Final   Serratia marcescens NOT DETECTED NOT DETECTED Final   Haemophilus influenzae NOT DETECTED NOT DETECTED Final   Neisseria meningitidis NOT DETECTED NOT DETECTED Final   Pseudomonas aeruginosa NOT DETECTED NOT DETECTED Final   Stenotrophomonas maltophilia NOT DETECTED NOT DETECTED Final   Candida albicans NOT DETECTED NOT DETECTED Final   Candida auris NOT  DETECTED NOT DETECTED Final   Candida glabrata NOT DETECTED NOT DETECTED Final   Candida krusei NOT DETECTED NOT DETECTED Final   Candida parapsilosis NOT DETECTED NOT DETECTED Final   Candida tropicalis NOT DETECTED NOT DETECTED Final   Cryptococcus neoformans/gattii NOT DETECTED NOT DETECTED Final   CTX-M ESBL NOT DETECTED NOT DETECTED Final   Carbapenem resistance IMP NOT DETECTED NOT DETECTED Final   Carbapenem resistance KPC NOT DETECTED NOT DETECTED Final   Carbapenem resistance NDM NOT DETECTED NOT DETECTED Final   Carbapenem resist OXA 48 LIKE NOT DETECTED NOT DETECTED Final   Carbapenem resistance VIM NOT DETECTED NOT DETECTED Final    Comment: Performed at [facility], [address], [place], [place] [address]  Urine Culture     Status: Abnormal   Collection Time: 12/29/24 11:31 PM   Specimen: Urine, Clean Catch  Result Value Ref Range Status   Specimen Description   Final    URINE, CLEAN CATCH Performed at [facility], [address] [place], [place] [address]    Special Requests   Final    NONE Performed at [facility], [address], [place], [place] [address]    Culture (A)  Final    >=100,000 COLONIES/mL KLEBSIELLA PNEUMONIAE >=100,000 COLONIES/mL ESCHERICHIA COLI    Report Status 01/02/2025 FINAL  Final   Organism ID, Bacteria KLEBSIELLA PNEUMONIAE (A)  Final   Organism ID, Bacteria ESCHERICHIA COLI (A)  Final      Susceptibility   Escherichia coli - MIC*    AMPICILLIN <=2 SENSITIVE Sensitive     CEFAZOLIN (URINE) Value in next row Sensitive      <=1 SENSITIVEThis is a modified FDA-approved test that has been validated and its performance characteristics determined by the reporting laboratory.  This laboratory is certified under the Clinical Laboratory Improvement Amendments CLIA as qualified to perform high complexity clinical laboratory testing.    CEFEPIME Value in next row Sensitive      <=1 SENSITIVEThis is a  modified FDA-approved test that has been validated and its performance characteristics determined by the reporting laboratory.  This laboratory is certified under the Clinical Laboratory Improvement Amendments CLIA as qualified to perform high complexity clinical laboratory testing.    ERTAPENEM Value in next row Sensitive      <=1 SENSITIVEThis is a modified FDA-approved test that has been validated and its performance characteristics determined by the reporting laboratory.  This laboratory is certified under the Clinical Laboratory Improvement Amendments CLIA as qualified to perform high complexity clinical laboratory testing.    CEFTRIAXONE  Value in next row Sensitive      <=1 SENSITIVEThis is a modified FDA-approved test that has been validated and its performance characteristics determined by the reporting laboratory.  This laboratory is certified under the Clinical Laboratory Improvement Amendments CLIA as qualified to perform high complexity clinical laboratory testing.    CIPROFLOXACIN  Value in next row Sensitive      <=1 SENSITIVEThis is a modified FDA-approved test that has been validated and its performance characteristics determined by the reporting laboratory.  This laboratory is certified under the Clinical Laboratory Improvement Amendments CLIA as qualified to perform high complexity clinical laboratory testing.    GENTAMICIN Value in next row Sensitive      <=1 SENSITIVEThis is a modified FDA-approved test that has been validated and its performance characteristics determined by the reporting laboratory.  This laboratory is certified under the Clinical Laboratory Improvement Amendments CLIA as qualified to perform high complexity clinical laboratory testing.    NITROFURANTOIN Value in next row Sensitive      <=1 SENSITIVEThis is a modified FDA-approved test that has been validated and its performance characteristics determined by the reporting laboratory.  This laboratory is certified under  the Clinical Laboratory Improvement Amendments CLIA as qualified to perform high complexity clinical laboratory testing.    TRIMETH/SULFA Value in next row Sensitive      <=1 SENSITIVEThis is a modified FDA-approved test that has been validated and its performance characteristics determined by the reporting laboratory.  This laboratory is certified under the Clinical Laboratory Improvement Amendments CLIA as qualified to perform high complexity clinical laboratory testing.    AMPICILLIN/SULBACTAM Value in next row Sensitive      <=1 SENSITIVEThis is a modified FDA-approved test that has been validated and its performance characteristics determined by the reporting laboratory.  This laboratory is certified under the Clinical Laboratory Improvement Amendments CLIA as qualified to perform high complexity clinical laboratory testing.    PIP/TAZO Value in next row Sensitive      <=4 SENSITIVEThis is a modified FDA-approved test that has been validated and its performance characteristics determined by the reporting laboratory.  This laboratory is certified under the Clinical Laboratory Improvement Amendments CLIA as qualified to perform high complexity clinical laboratory testing.    MEROPENEM Value in next row Sensitive      <=4 SENSITIVEThis is a modified FDA-approved test that has been validated and its performance characteristics determined by the reporting laboratory.  This laboratory is certified under the Clinical Laboratory Improvement Amendments CLIA as qualified to perform high complexity clinical laboratory testing.    * >=100,000 COLONIES/mL ESCHERICHIA COLI   Klebsiella pneumoniae - MIC*    AMPICILLIN Value in next row Resistant      <=4 SENSITIVEThis is a modified FDA-approved test that has been validated and its performance characteristics determined by the reporting laboratory.  This laboratory is certified under the Clinical Laboratory Improvement Amendments CLIA as qualified to perform high  complexity clinical laboratory testing.    CEFAZOLIN (URINE) Value in next row Sensitive      2 SENSITIVEThis is a modified FDA-approved test that has been validated and its performance characteristics determined by the reporting laboratory.  This laboratory is certified under the Clinical Laboratory Improvement Amendments CLIA as qualified to perform high complexity clinical laboratory testing.    CEFEPIME Value in next row Sensitive      2 SENSITIVEThis is a modified FDA-approved test that has been validated and its performance characteristics determined by the reporting laboratory.  This laboratory is certified under the Clinical Laboratory Improvement Amendments CLIA as qualified to perform high complexity clinical laboratory testing.    ERTAPENEM Value in next row Sensitive      2 SENSITIVEThis is a modified FDA-approved test that has been validated and its performance characteristics determined by the reporting laboratory.  This laboratory is certified under the Clinical Laboratory Improvement Amendments CLIA as qualified to perform high complexity clinical laboratory testing.    CEFTRIAXONE  Value in next row Sensitive      2 SENSITIVEThis is a modified FDA-approved test that has been validated and its performance characteristics determined by the reporting laboratory.  This laboratory is certified under the Clinical Laboratory Improvement Amendments CLIA as qualified to perform high complexity clinical laboratory testing.    CIPROFLOXACIN  Value in next row Sensitive      2 SENSITIVEThis is a modified FDA-approved test that has been validated and its performance characteristics determined by the reporting laboratory.  This laboratory is certified under the Clinical Laboratory Improvement Amendments CLIA as qualified to perform high complexity clinical laboratory testing.    GENTAMICIN Value in next row Sensitive      2 SENSITIVEThis is a modified FDA-approved test that has been validated and its  performance characteristics determined by the reporting laboratory.  This laboratory is certified under the Clinical Laboratory Improvement Amendments CLIA as qualified to perform high complexity clinical laboratory testing.    NITROFURANTOIN Value in next row Intermediate      2 SENSITIVEThis is a modified FDA-approved test that has been validated and its performance characteristics determined by the reporting laboratory.  This laboratory is certified under the Clinical Laboratory Improvement Amendments CLIA as qualified to perform high complexity clinical laboratory testing.    TRIMETH/SULFA Value in next row Sensitive  2 SENSITIVEThis is a modified FDA-approved test that has been validated and its performance characteristics determined by the reporting laboratory.  This laboratory is certified under the Clinical Laboratory Improvement Amendments CLIA as qualified to perform high complexity clinical laboratory testing.    AMPICILLIN/SULBACTAM Value in next row Sensitive      2 SENSITIVEThis is a modified FDA-approved test that has been validated and its performance characteristics determined by the reporting laboratory.  This laboratory is certified under the Clinical Laboratory Improvement Amendments CLIA as qualified to perform high complexity clinical laboratory testing.    PIP/TAZO Value in next row Sensitive      <=4 SENSITIVEThis is a modified FDA-approved test that has been validated and its performance characteristics determined by the reporting laboratory.  This laboratory is certified under the Clinical Laboratory Improvement Amendments CLIA as qualified to perform high complexity clinical laboratory testing.    MEROPENEM Value in next row Sensitive      <=4 SENSITIVEThis is a modified FDA-approved test that has been validated and its performance characteristics determined by the reporting laboratory.  This laboratory is certified under the Clinical Laboratory Improvement Amendments CLIA as  qualified to perform high complexity clinical laboratory testing.    * >=100,000 COLONIES/mL KLEBSIELLA PNEUMONIAE  Gastrointestinal Panel by PCR , Stool     Status: None   Collection Time: 12/30/24  3:16 AM   Specimen: STOOL  Result Value Ref Range Status   Campylobacter species NOT DETECTED NOT DETECTED Final   Plesimonas shigelloides NOT DETECTED NOT DETECTED Final   Salmonella species NOT DETECTED NOT DETECTED Final   Yersinia enterocolitica NOT DETECTED NOT DETECTED Final   Vibrio species NOT DETECTED NOT DETECTED Final   Vibrio cholerae NOT DETECTED NOT DETECTED Final   Enteroaggregative E coli (EAEC) NOT DETECTED NOT DETECTED Final   Enteropathogenic E coli (EPEC) NOT DETECTED NOT DETECTED Final   Enterotoxigenic E coli (ETEC) NOT DETECTED NOT DETECTED Final   Shiga like toxin producing E coli (STEC) NOT DETECTED NOT DETECTED Final   Shigella/Enteroinvasive E coli (EIEC) NOT DETECTED NOT DETECTED Final   Cryptosporidium NOT DETECTED NOT DETECTED Final   Cyclospora cayetanensis NOT DETECTED NOT DETECTED Final   Entamoeba histolytica NOT DETECTED NOT DETECTED Final   Giardia lamblia NOT DETECTED NOT DETECTED Final   Adenovirus F40/41 NOT DETECTED NOT DETECTED Final   Astrovirus NOT DETECTED NOT DETECTED Final   Norovirus GI/GII NOT DETECTED NOT DETECTED Final   Rotavirus A NOT DETECTED NOT DETECTED Final   Sapovirus (I, II, IV, and V) NOT DETECTED NOT DETECTED Final    Comment: Performed at [facility], [address] [place], [place] [address]  Blood Culture (routine x 2)     Status: None (Preliminary result)   Collection Time: 12/30/24  1:49 PM   Specimen: BLOOD RIGHT HAND  Result Value Ref Range Status   Specimen Description   Final    BLOOD RIGHT HAND Performed at [facility], [address] [place], [place] [address]    Special Requests   Final    BOTTLES DRAWN AEROBIC AND ANAEROBIC Blood Culture adequate volume Performed at [facility], [address] [place], [place] [address]    Culture   Final    NO GROWTH 3 DAYS Performed at [facility], [address] [place], [place] [address]    Report Status PENDING  Incomplete     Time coordinating discharge: Over 30 minutes  SIGNED:  Camellia PARAS Waynetta Metheny, DO  [facility] 01/02/2025, 11:59 AM     [1]  Allergies Allergen Reactions   Tape Other (See Comments)    SKIN TEARS AND BRUISES EASILY!!!!   Amoxicillin-Pot Clavulanate Other (See Comments)    Reaction not recalled   Ciprofloxacin  Other (See Comments)    Reaction not recalled   Diphenhydramine Other (See Comments)    Reaction not recalled   Escitalopram Other (See Comments)    Reaction not recalled   Nitrofurantoin Other (See Comments)    Reaction not recalled   "

## 2025-01-04 LAB — CULTURE, BLOOD (ROUTINE X 2)
Culture: NO GROWTH
Special Requests: ADEQUATE

## 2025-01-06 ENCOUNTER — Other Ambulatory Visit: Payer: Self-pay | Admitting: Urology

## 2025-01-11 ENCOUNTER — Telehealth: Payer: Self-pay

## 2025-01-11 DIAGNOSIS — A419 Sepsis, unspecified organism: Secondary | ICD-10-CM

## 2025-01-12 ENCOUNTER — Encounter (HOSPITAL_COMMUNITY): Payer: Self-pay

## 2025-01-12 NOTE — Patient Instructions (Addendum)
 SURGICAL WAITING ROOM VISITATION Patients having surgery or a procedure may have no more than 2 support people in the waiting area - these visitors may rotate.    Children under the age of 75 will not be allowed to visit due to the increase in respiratory illness  Children under the age of 76 must have an adult with them who is not the patient.  If the patient needs to stay at the hospital during part of their recovery, the visitor guidelines for inpatient rooms apply. Pre-op nurse will coordinate an appropriate time for 1 support person to accompany patient in pre-op.  This support person may not rotate.    Please refer to the [facility] website for the visitor guidelines for Inpatients (after your surgery is over and you are in a regular room).       Your procedure is scheduled on: 01-18-25   Report to [facility] Main Entrance    Report to admitting at 11:45 AM   Call this number if you have problems the morning of surgery [phone]   Do not eat food or drink liquids :After Midnight.          If you have questions, please contact your surgeons office.   FOLLOW  ANY ADDITIONAL PRE OP INSTRUCTIONS YOU RECEIVED FROM YOUR SURGEON'S OFFICE!!!     Oral Hygiene is also important to reduce your risk of infection.                                    Remember - BRUSH YOUR TEETH THE MORNING OF SURGERY WITH YOUR REGULAR TOOTHPASTE   Do NOT smoke after Midnight   Take these medicines the morning of surgery with A SIP OF WATER :    Nexium    Rosuvastatin  (Crestor )  Stop all vitamins and herbal supplements 7 days before surgery                              You may not have any metal on your body including hair pins, jewelry, and body piercing             Do not wear make-up, lotions, powders, perfumes or deodorant  Do not wear nail polish including gel and S&S, artificial/acrylic nails, or any other type of covering on natural nails including finger and toenails. If you have  artificial nails, gel coating, etc. that needs to be removed by a nail salon please have this removed prior to surgery or surgery may need to be canceled/ delayed if the surgeon/ anesthesia feels like they are unable to be safely monitored.   Do not shave  48 hours prior to surgery.      Do not bring valuables to the hospital. [facility] IS NOT RESPONSIBLE   FOR VALUABLES.   Contacts, dentures or bridgework may not be worn into surgery.  DO NOT BRING YOUR HOME MEDICATIONS TO THE HOSPITAL. PHARMACY WILL DISPENSE MEDICATIONS LISTED ON YOUR MEDICATION LIST TO YOU DURING YOUR ADMISSION IN THE HOSPITAL!    Patients discharged on the day of surgery will not be allowed to drive home.  Someone NEEDS to stay with you for the first 24 hours after anesthesia.              Please read over the following fact sheets you were given: IF YOU HAVE QUESTIONS ABOUT YOUR PRE-OP INSTRUCTIONS  PLEASE CALL [phone] [facility] or [phone]  If you received a COVID test during your pre-op visit  it is requested that you wear a mask when out in public, stay away from anyone that may not be feeling well and notify your surgeon if you develop symptoms. If you test positive for Covid or have been in contact with anyone that has tested positive in the last 10 days please notify you surgeon.  [facility] - Preparing for Surgery Before surgery, you can play an important role.  Because skin is not sterile, your skin needs to be as free of germs as possible.  You can reduce the number of germs on your skin by washing with CHG (chlorahexidine gluconate) soap before surgery.  CHG is an antiseptic cleaner which kills germs and bonds with the skin to continue killing germs even after washing. Please DO NOT use if you have an allergy to CHG or antibacterial soaps.  If your skin becomes reddened/irritated stop using the CHG and inform your nurse when you arrive at Short Stay. Do not shave (including legs and underarms) for at least 48  hours prior to the first CHG shower.  You may shave your face/neck.  Please follow these instructions carefully:  1.  Shower with CHG Soap the night before surgery and the  morning of surgery.  2.  If you choose to wash your hair, wash your hair first as usual with your normal  shampoo.  3.  After you shampoo, rinse your hair and body thoroughly to remove the shampoo.                             4.  Use CHG as you would any other liquid soap.  You can apply chg directly to the skin and wash.  Gently with a scrungie or clean washcloth.  5.  Apply the CHG Soap to your body ONLY FROM THE NECK DOWN.   Do   not use on face/ open                           Wound or open sores. Avoid contact with eyes, ears mouth and   genitals (private parts).                       Wash face,  Genitals (private parts) with your normal soap.             6.  Wash thoroughly, paying special attention to the area where your    surgery  will be performed.  7.  Thoroughly rinse your body with warm water  from the neck down.  8.  DO NOT shower/wash with your normal soap after using and rinsing off the CHG Soap.                9.  Pat yourself dry with a clean towel.            10.  Wear clean pajamas.            11.  Place clean sheets on your bed the night of your first shower and do not  sleep with pets. Day of Surgery : Do not apply any lotions/deodorants the morning of surgery.  Please wear clean clothes to the hospital/surgery center.  FAILURE TO FOLLOW THESE INSTRUCTIONS MAY RESULT IN THE CANCELLATION OF YOUR SURGERY  PATIENT SIGNATURE_________________________________  NURSE  SIGNATURE__________________________________  ________________________________________________________________________

## 2025-01-12 NOTE — Progress Notes (Addendum)
 Date of COVID positive in last 90 days:  No  PCP - Olam Pinal, MD Cardiologist - N/A  Chest x-ray - 12-29-24 Epic EKG -   DOS  Stress Test - N/A ECHO - N/A Cardiac Cath - N/A Pacemaker/ICD device last checked:N/A Spinal Cord Stimulator:N/A  Bowel Prep - N/A  Sleep Study - N/A CPAP -   Fasting Blood Sugar - N/A Checks Blood Sugar _____ times a day  Last dose of GLP1 agonist-  N/A GLP1 instructions:  Do not take after     Last dose of SGLT-2 inhibitors-  N/A SGLT-2 instructions:  Do not take after    Blood Thinner Instructions: N/A Last dose:   Time: Aspirin Instructions:N/A Last Dose:  Activity level:  Can go up a flight of stairs and perform activities of daily living without stopping and without symptoms of chest pain or shortness of breath.  Anesthesia review: N/A  Patient denies shortness of breath, fever, cough and chest pain at PAT appointment  Patient verbalized understanding of instructions that were given to them at the PAT appointment. Patient was also instructed that they will need to review over the PAT instructions again at home before surgery.

## 2025-01-15 ENCOUNTER — Encounter (HOSPITAL_COMMUNITY)
Admission: RE | Admit: 2025-01-15 | Discharge: 2025-01-15 | Disposition: A | Source: Ambulatory Visit | Attending: Urology

## 2025-01-15 ENCOUNTER — Other Ambulatory Visit: Payer: Self-pay

## 2025-01-15 ENCOUNTER — Encounter (HOSPITAL_COMMUNITY): Payer: Self-pay

## 2025-01-15 VITALS — Ht 63.0 in | Wt 109.0 lb

## 2025-01-15 DIAGNOSIS — I10 Essential (primary) hypertension: Secondary | ICD-10-CM

## 2025-01-15 HISTORY — DX: Personal history of urinary calculi: Z87.442

## 2025-01-18 ENCOUNTER — Encounter (HOSPITAL_COMMUNITY): Payer: Self-pay | Admitting: Anesthesiology

## 2025-01-18 ENCOUNTER — Ambulatory Visit (HOSPITAL_COMMUNITY)

## 2025-01-18 ENCOUNTER — Encounter (HOSPITAL_COMMUNITY)
Admission: RE | Disposition: A | Discharge status: Home / Self Care | Payer: Self-pay | Source: Home / Self Care | Attending: Urology

## 2025-01-18 ENCOUNTER — Ambulatory Visit (HOSPITAL_COMMUNITY)
Admission: RE | Admit: 2025-01-18 | Discharge: 2025-01-18 | Disposition: A | Discharge status: Home / Self Care | Attending: Urology | Admitting: Urology

## 2025-01-18 ENCOUNTER — Encounter (HOSPITAL_COMMUNITY): Payer: Self-pay | Admitting: Urology

## 2025-01-18 DIAGNOSIS — N202 Calculus of kidney with calculus of ureter: Secondary | ICD-10-CM | POA: Insufficient documentation

## 2025-01-18 DIAGNOSIS — N201 Calculus of ureter: Secondary | ICD-10-CM | POA: Diagnosis not present

## 2025-01-18 DIAGNOSIS — I10 Essential (primary) hypertension: Secondary | ICD-10-CM

## 2025-01-18 LAB — BASIC METABOLIC PANEL WITH GFR
Anion gap: 11 (ref 5–15)
BUN: 18 mg/dL (ref 8–23)
CO2: 28 mmol/L (ref 22–32)
Calcium: 10.2 mg/dL (ref 8.9–10.3)
Chloride: 98 mmol/L (ref 98–111)
Creatinine, Ser: 0.94 mg/dL (ref 0.44–1.00)
GFR, Estimated: 59 mL/min — ABNORMAL LOW
Glucose, Bld: 112 mg/dL — ABNORMAL HIGH (ref 70–99)
Potassium: 3.6 mmol/L (ref 3.5–5.1)
Sodium: 137 mmol/L (ref 135–145)

## 2025-01-18 MED ORDER — CEFAZOLIN SODIUM-DEXTROSE 2-4 GM/100ML-% IV SOLN
INTRAVENOUS | Status: AC
  Filled 2025-01-18: qty 100

## 2025-01-18 MED ORDER — PHENYLEPHRINE 80 MCG/ML (10ML) SYRINGE FOR IV PUSH (FOR BLOOD PRESSURE SUPPORT)
PREFILLED_SYRINGE | INTRAVENOUS | Status: AC
  Filled 2025-01-18: qty 10

## 2025-01-18 MED ORDER — DEXAMETHASONE SOD PHOSPHATE PF 10 MG/ML IJ SOLN
INTRAMUSCULAR | Status: AC
  Filled 2025-01-18: qty 1

## 2025-01-18 MED ORDER — PHENYLEPHRINE 80 MCG/ML (10ML) SYRINGE FOR IV PUSH (FOR BLOOD PRESSURE SUPPORT)
PREFILLED_SYRINGE | INTRAVENOUS | Status: DC | PRN
  Administered 2025-01-18 (×3): 160 ug via INTRAVENOUS

## 2025-01-18 MED ORDER — LIDOCAINE HCL (PF) 2 % IJ SOLN
INTRAMUSCULAR | Status: AC
  Filled 2025-01-18: qty 5

## 2025-01-18 MED ORDER — CHLORHEXIDINE GLUCONATE 0.12 % MT SOLN
15.0000 mL | Freq: Once | OROMUCOSAL | Status: AC
  Administered 2025-01-18: 15 mL via OROMUCOSAL

## 2025-01-18 MED ORDER — PROPOFOL 10 MG/ML IV BOLUS
INTRAVENOUS | Status: AC
  Filled 2025-01-18: qty 20

## 2025-01-18 MED ORDER — SODIUM CHLORIDE 0.9 % IR SOLN
Status: DC | PRN
  Administered 2025-01-18: 3000 mL via INTRAVESICAL

## 2025-01-18 MED ORDER — TRAMADOL HCL 50 MG PO TABS: 50.0000 mg | ORAL_TABLET | Freq: Four times a day (QID) | ORAL | 0 refills | Status: AC | PRN

## 2025-01-18 MED ORDER — CEFAZOLIN SODIUM-DEXTROSE 2-4 GM/100ML-% IV SOLN
2.0000 g | Freq: Once | INTRAVENOUS | Status: AC
  Administered 2025-01-18: 2 g via INTRAVENOUS

## 2025-01-18 MED ORDER — LACTATED RINGERS IV SOLN: INTRAVENOUS | Status: DC

## 2025-01-18 MED ORDER — DEXAMETHASONE SOD PHOSPHATE PF 10 MG/ML IJ SOLN
INTRAMUSCULAR | Status: DC | PRN
  Administered 2025-01-18: 5 mg via INTRAVENOUS

## 2025-01-18 MED ORDER — ORAL CARE MOUTH RINSE: 15.0000 mL | Freq: Once | OROMUCOSAL | Status: AC

## 2025-01-18 MED ORDER — ACETAMINOPHEN 500 MG PO TABS
1000.0000 mg | ORAL_TABLET | Freq: Once | ORAL | Status: AC
  Administered 2025-01-18: 1000 mg via ORAL
  Filled 2025-01-18: qty 2

## 2025-01-18 MED ORDER — IOHEXOL 300 MG/ML  SOLN
INTRAMUSCULAR | Status: DC | PRN
  Administered 2025-01-18: 8 mL via URETHRAL

## 2025-01-18 MED ORDER — PROPOFOL 10 MG/ML IV BOLUS
INTRAVENOUS | Status: DC | PRN
  Administered 2025-01-18: 20 mg via INTRAVENOUS
  Administered 2025-01-18: 80 mg via INTRAVENOUS

## 2025-01-18 MED ORDER — ONDANSETRON HCL 4 MG/2ML IJ SOLN
INTRAMUSCULAR | Status: DC | PRN
  Administered 2025-01-18: 4 mg via INTRAVENOUS

## 2025-01-18 MED ORDER — FENTANYL CITRATE (PF) 100 MCG/2ML IJ SOLN
INTRAMUSCULAR | Status: AC
  Filled 2025-01-18: qty 2

## 2025-01-18 MED ORDER — FENTANYL CITRATE (PF) 100 MCG/2ML IJ SOLN
INTRAMUSCULAR | Status: DC | PRN
  Administered 2025-01-18 (×2): 25 ug via INTRAVENOUS

## 2025-01-18 MED ORDER — LIDOCAINE HCL (CARDIAC) PF 100 MG/5ML IV SOSY
PREFILLED_SYRINGE | INTRAVENOUS | Status: DC | PRN
  Administered 2025-01-18: 50 mg via INTRAVENOUS

## 2025-01-18 MED ORDER — ONDANSETRON HCL 4 MG/2ML IJ SOLN
INTRAMUSCULAR | Status: AC
  Filled 2025-01-18: qty 2

## 2025-01-18 NOTE — Interval H&P Note (Signed)
 History and Physical Interval Note:  01/18/2025 9:54 AM  Annette Hernandez  has presented today for surgery, with the diagnosis of LEFT URETERAL STONE.  The various methods of treatment have been discussed with the patient and family. After consideration of risks, benefits and other options for treatment, the patient has consented to  Procedures: CYSTOSCOPY/URETEROSCOPY/HOLMIUM LASER/STENT PLACEMENT (Left) as a surgical intervention.  The patient's history has been reviewed, patient examined, no change in status, stable for surgery.  I have reviewed the patient's chart and labs.  Questions were answered to the patient's satisfaction.     [employer]

## 2025-01-18 NOTE — Discharge Instructions (Addendum)
You may see some blood in the urine and may have some burning with urination for 48-72 hours. You also may notice that you have to urinate more frequently or urgently after your procedure which is normal.  You should call should you develop an inability urinate, fever > 101, persistent nausea and vomiting that prevents you from eating or drinking to stay hydrated.  If you have a stent, you will likely urinate more frequently and urgently until the stent is removed and you may experience some discomfort/pain in the lower abdomen and flank especially when urinating. You may take pain medication prescribed to you if needed for pain. You may also intermittently have blood in the urine until the stent is removed. .You may remove your stent on Friday morning.  Simply pull the string that is taped to your body and the stent will easily come out.  This may be best done in the shower as some urine may come out with the stent.  Usually you will feel relief once the stent is removed, but occasionally patients can develop pain due to residual swelling of the ureter that may temporarily obstruct the kidney.  This can be managed by taking pain medication and it will typically resolve with time.  Please do not hesitate to call if you have pain that is not controlled with your pain medication or does not improved within 24-48 hours.   

## 2025-01-18 NOTE — Anesthesia Procedure Notes (Signed)
 Procedure Name: LMA Insertion Date/Time: 01/18/2025 11:54 AM  Performed by: Therisa Doyal CROME, CRNAPatient Re-evaluated:Patient Re-evaluated prior to induction Oxygen Delivery Method: Circle system utilized Preoxygenation: Pre-oxygenation with 100% oxygen Induction Type: IV induction LMA: LMA inserted LMA Size: 3.0 Number of attempts: 1 Placement Confirmation: positive ETCO2 and breath sounds checked- equal and bilateral Tube secured with: Tape Dental Injury: Teeth and Oropharynx as per pre-operative assessment

## 2025-01-18 NOTE — Anesthesia Preprocedure Evaluation (Addendum)
"                                    Anesthesia Evaluation  Patient identified by MRN, date of birth, ID band Patient awake    Reviewed: Allergy & Precautions, NPO status , Patient's Chart, lab work & pertinent test results  Airway Mallampati: II  TM Distance: >3 FB Neck ROM: Full    Dental no notable dental hx. (+) Teeth Intact, Dental Advisory Given   Pulmonary neg pulmonary ROS   Pulmonary exam normal breath sounds clear to auscultation       Cardiovascular hypertension, Pt. on medications Normal cardiovascular exam Rhythm:Regular Rate:Normal     Neuro/Psych  PSYCHIATRIC DISORDERS  Depression    negative neurological ROS     GI/Hepatic Neg liver ROS,GERD  Medicated,,  Endo/Other  negative endocrine ROS    Renal/GU negative Renal ROS  negative genitourinary   Musculoskeletal negative musculoskeletal ROS (+)    Abdominal   Peds  Hematology negative hematology ROS (+)   Anesthesia Other Findings   Reproductive/Obstetrics                              Anesthesia Physical Anesthesia Plan  ASA: 2  Anesthesia Plan: General   Post-op Pain Management: Tylenol  PO (pre-op)*   Induction: Intravenous  PONV Risk Score and Plan: 3 and Ondansetron , Dexamethasone  and Treatment may vary due to age or medical condition  Airway Management Planned: LMA  Additional Equipment:   Intra-op Plan:   Post-operative Plan: Extubation in OR  Informed Consent: I have reviewed the patients History and Physical, chart, labs and discussed the procedure including the risks, benefits and alternatives for the proposed anesthesia with the patient or authorized representative who has indicated his/her understanding and acceptance.     Dental advisory given  Plan Discussed with: CRNA  Anesthesia Plan Comments:          Anesthesia Quick Evaluation  "

## 2025-01-18 NOTE — Transfer of Care (Signed)
 Immediate Anesthesia Transfer of Care Note  Patient: Annette Hernandez  Procedure(s) Performed: CYSTOSCOPY/URETEROSCOPY/HOLMIUM LASER/STENT PLACEMENT (Left: Pelvis)  Patient Location: PACU  Anesthesia Type:General  Level of Consciousness: awake and alert   Airway & Oxygen Therapy: Patient Spontanous Breathing and Patient connected to face mask oxygen  Post-op Assessment: Report given to RN and Post -op Vital signs reviewed and stable  Post vital signs: Reviewed and stable  Last Vitals:  Vitals Value Taken Time  BP 154/75 01/18/25 13:15  Temp    Pulse 73 01/18/25 13:15  Resp 14 01/18/25 13:15  SpO2 100 % 01/18/25 13:15  Vitals shown include unfiled device data.  Last Pain:  Vitals:   01/18/25 1019  TempSrc: Oral  PainSc: 0-No pain         Complications: No notable events documented.

## 2025-01-19 ENCOUNTER — Encounter (HOSPITAL_COMMUNITY): Payer: Self-pay | Admitting: Urology
